# Patient Record
Sex: Male | Born: 1975
Health system: Southern US, Community
[De-identification: ages and names within clinical notes are randomized; demographics above are authoritative.]

## PROBLEM LIST (undated history)

## (undated) DIAGNOSIS — D649 Anemia, unspecified: Secondary | ICD-10-CM

## (undated) DIAGNOSIS — J45909 Unspecified asthma, uncomplicated: Secondary | ICD-10-CM

## (undated) DIAGNOSIS — I2699 Other pulmonary embolism without acute cor pulmonale: Secondary | ICD-10-CM

## (undated) DIAGNOSIS — I82409 Acute embolism and thrombosis of unspecified deep veins of unspecified lower extremity: Secondary | ICD-10-CM

## (undated) DIAGNOSIS — I8290 Acute embolism and thrombosis of unspecified vein: Secondary | ICD-10-CM

## (undated) HISTORY — DX: Unspecified asthma, uncomplicated: J45.909

---

## 2008-06-27 ENCOUNTER — Emergency Department (HOSPITAL_COMMUNITY): Admission: EM | Admit: 2008-06-27 | Discharge: 2008-06-27 | Payer: Self-pay | Admitting: Emergency Medicine

## 2008-07-16 ENCOUNTER — Inpatient Hospital Stay (HOSPITAL_COMMUNITY): Admission: EM | Admit: 2008-07-16 | Discharge: 2008-07-19 | Payer: Self-pay | Admitting: Family Medicine

## 2008-08-16 ENCOUNTER — Inpatient Hospital Stay (HOSPITAL_COMMUNITY): Admission: EM | Admit: 2008-08-16 | Discharge: 2008-08-19 | Payer: Self-pay | Admitting: Emergency Medicine

## 2010-01-10 IMAGING — CT CT ABDOMEN W/ CM
2 of 4 series · 16 of 46 positions shown, 18 images · IV contrast (APPLIED)
Comparison: CT abdomen pelvis 06/27/2008

CT ABDOMEN

CLINICAL DATA: Right lower quadrant abdominal pain and fever.
Recent history of urinary tract infection/pyelonephritis.

CT ABDOMEN AND PELVIS WITH CONTRAST
TECHNIQUE: Multidetector CT imaging of the abdomen and pelvis was
performed using the standard protocol following bolus
administration of intravenous contrast.
Contrast: 80 ml Tmnipaque-TYY

[Series 2: abd/pelv with 5.0 b31f st · axial · 0.65mm/px · z∈[-606,-201]mm · 13 of 89 slices shown, 15 images]
[im 4/89  soft-tissue]
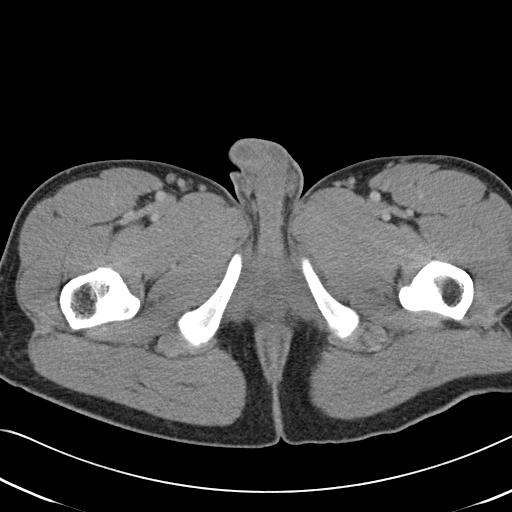
[im 4/89  bone]
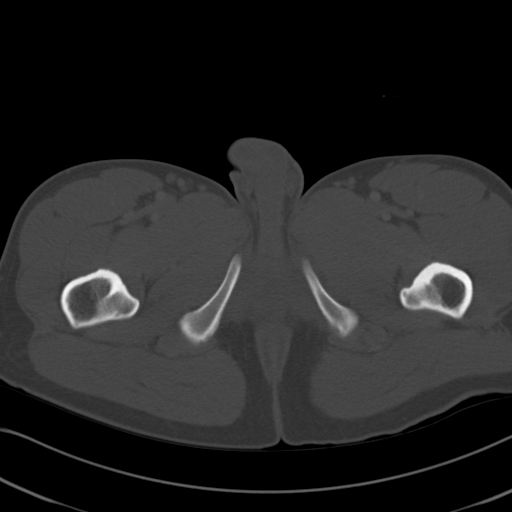
[im 12/89  soft-tissue]
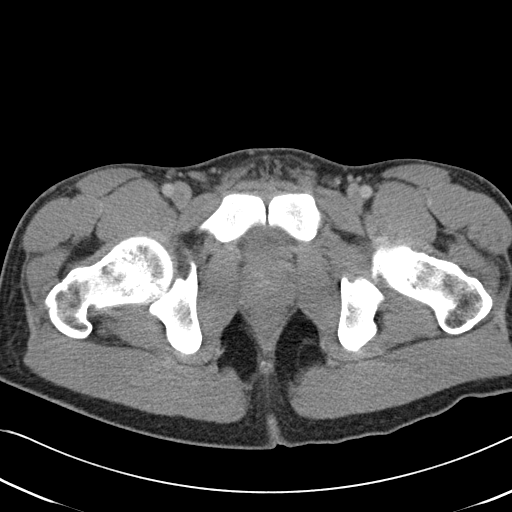
[im 20/89  soft-tissue]
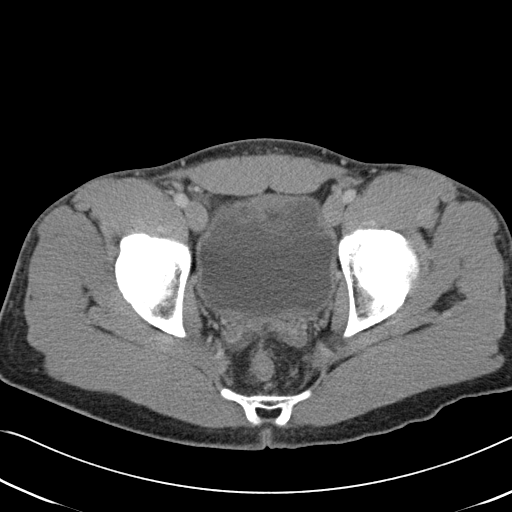
[im 23/89  soft-tissue]
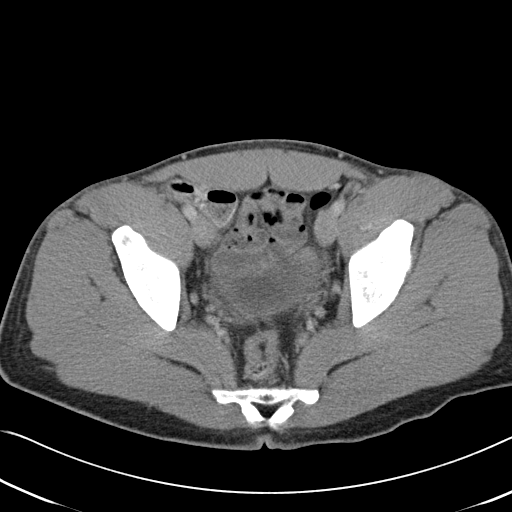
[im 31/89  soft-tissue]
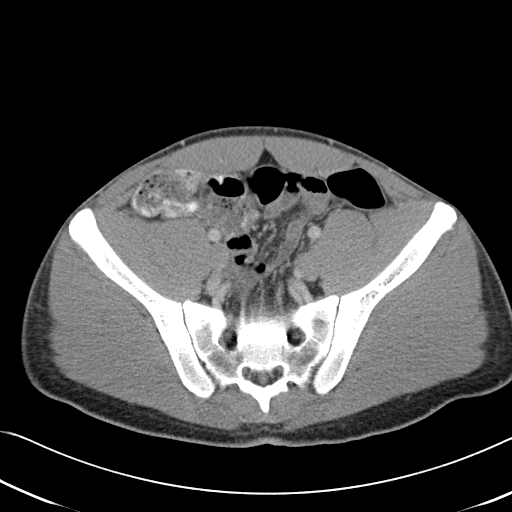
[im 39/89  soft-tissue]
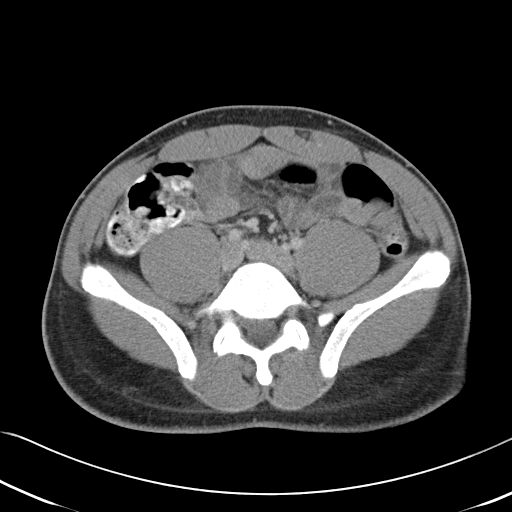
[im 46/89  soft-tissue]
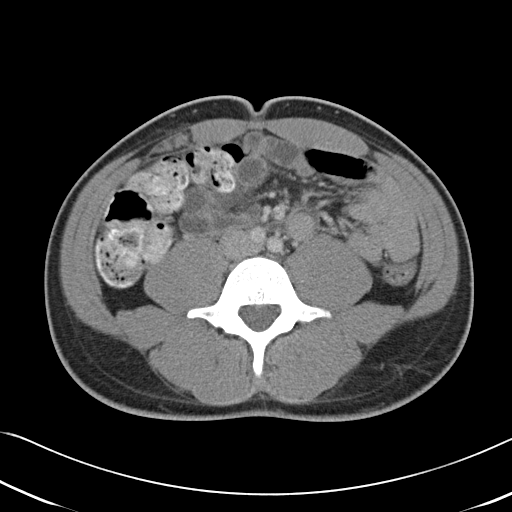
[im 50/89  soft-tissue]
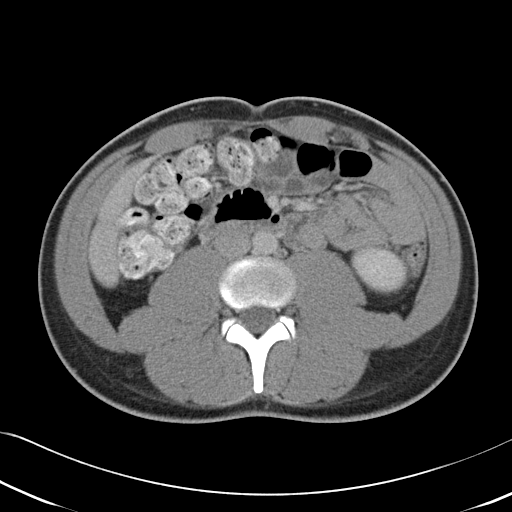
[im 58/89  soft-tissue]
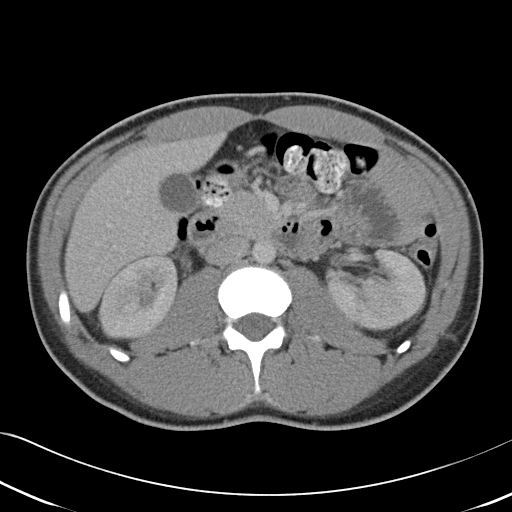
[im 58/89  bone]
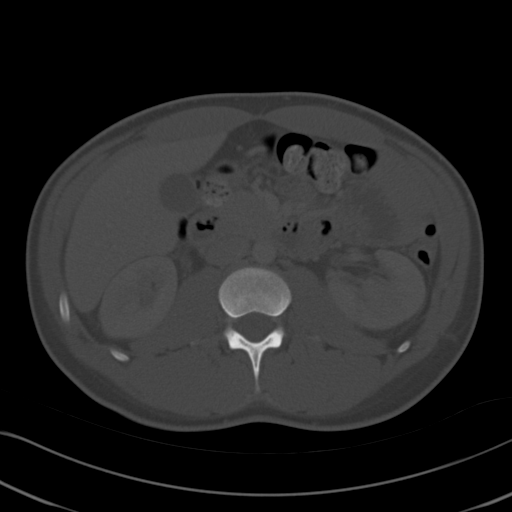
[im 66/89  soft-tissue]
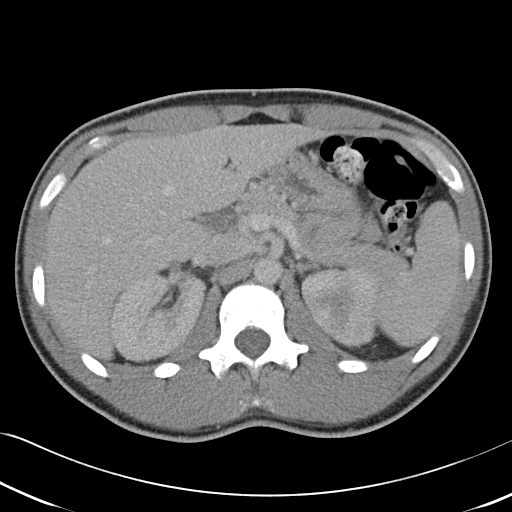
[im 69/89  soft-tissue]
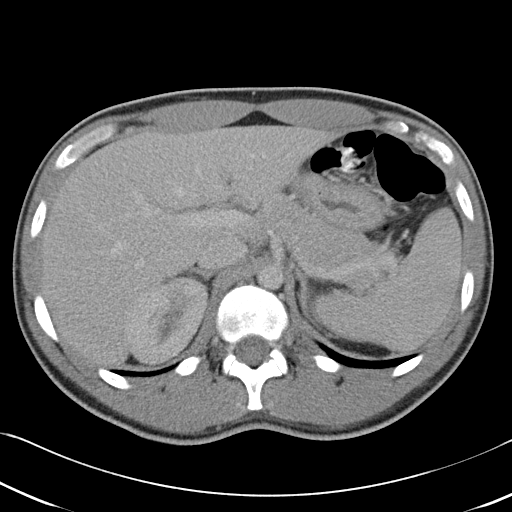
[im 77/89  soft-tissue]
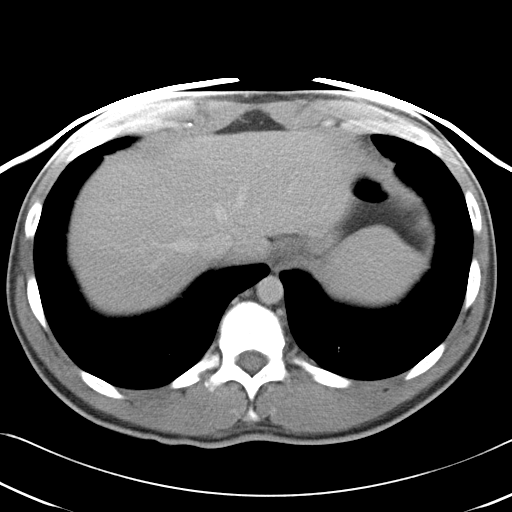
[im 85/89  soft-tissue]
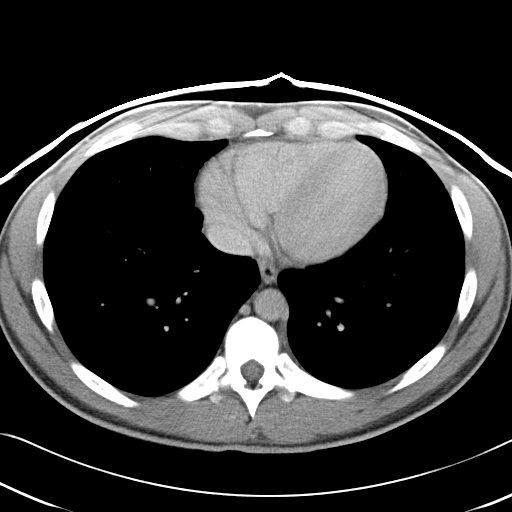

[Series 5: abd/pelv with 2.0 spo st · coronal · 0.84mm/px · 3 of 111 slices shown]
[im 37/111  soft-tissue]
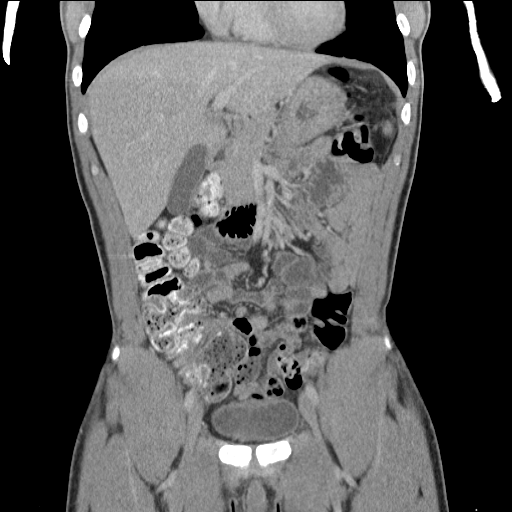
[im 49/111  soft-tissue]
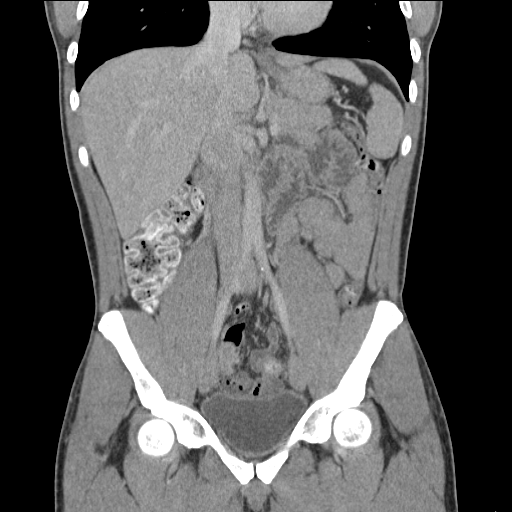
[im 62/111  soft-tissue]
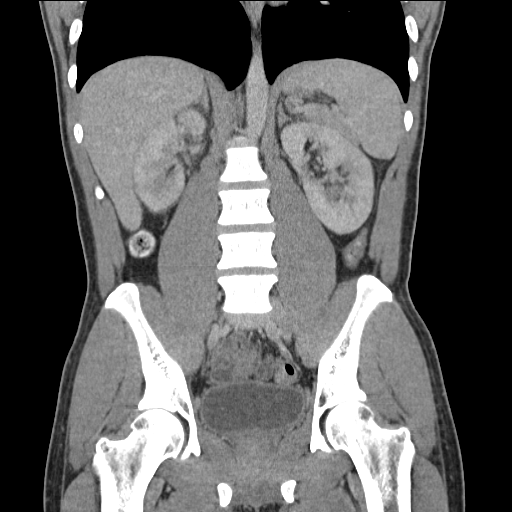

[16 of 46 positions shown; findings below may reference images not displayed]

FINDINGS: Visualized lung bases are clear.  Negative for pleural effusion.

Kidneys are normal in size measuring approximately 10 cm in length
bilaterally.  There is no hydronephrosis.  A focal area of patchy
decreased enhancement of the cortex of the posterior left kidney in
the mid to lower pole likely reflects pyelonephritis.  There is
persistent circumferential enhancement of the urothelial lining of
the proximal to mid left ureter, which appears similar compared to
the 06/27/2008.  Urothelial enhancement is also noted in the
infundibulum and calyces of the upper pole and of the infundibulum
to the lower pole (best seen on image 58 of series 5, coronal).
There is mild left perinephric stranding.  No renal abscess is
identified. No renal scarring identified.

The liver, gallbladder, adrenal glands, spleen, pancreas,
gallbladder, biliary tree are  within normal limits.

Bowel loops are normal in caliber.

Negative for lymphadenopathy.

The visualized vertebral bodies normal in height and alignment.
IMPRESSION: 1.  Abnormal urothelial enhancement of the collecting system of the
left kidney and proximal to mid left ureter consistent with urinary
tract infection.  Focal patchy area of decreased enhancement in the
posterior mid to lower pole of the left kidney suggests focal
pyelonephritis.
2.  No renal abscess is identified.

CT PELVIS
FINDINGS: The urinary bladder is moderately distended with urine.
The urinary bladder wall is within normal limits for thickness.
The distal ureters are unremarkable.  Seminal vesicles and prostate
gland are within normal limits.  Pelvic bowel loops are not
distended.  No free fluid or lymphadenopathy identified.  Bony
pelvis unremarkable.
IMPRESSION: No acute worrisome findings in the pelvis.

## 2010-06-27 LAB — CBC
HCT: 36.8 % — ABNORMAL LOW (ref 39.0–52.0)
Hemoglobin: 12.5 g/dL — ABNORMAL LOW (ref 13.0–17.0)
MCV: 89.2 fL (ref 78.0–100.0)
Platelets: 238 10*3/uL (ref 150–400)
WBC: 6.7 10*3/uL (ref 4.0–10.5)

## 2010-06-27 LAB — BASIC METABOLIC PANEL
BUN: 9 mg/dL (ref 6–23)
Chloride: 108 mEq/L (ref 96–112)
Potassium: 4.4 mEq/L (ref 3.5–5.1)
Sodium: 141 mEq/L (ref 135–145)

## 2010-06-28 LAB — URINE CULTURE

## 2010-06-28 LAB — CULTURE, BLOOD (ROUTINE X 2)
Culture: NO GROWTH
Culture: NO GROWTH

## 2010-06-28 LAB — DIFFERENTIAL
Basophils Absolute: 0.1 10*3/uL (ref 0.0–0.1)
Basophils Relative: 0 % (ref 0–1)
Basophils Relative: 1 % (ref 0–1)
Eosinophils Absolute: 0.1 10*3/uL (ref 0.0–0.7)
Eosinophils Absolute: 0.2 10*3/uL (ref 0.0–0.7)
Lymphocytes Relative: 18 % (ref 12–46)
Lymphs Abs: 1.3 10*3/uL (ref 0.7–4.0)
Monocytes Absolute: 1.3 10*3/uL — ABNORMAL HIGH (ref 0.1–1.0)
Monocytes Relative: 11 % (ref 3–12)
Monocytes Relative: 9 % (ref 3–12)
Neutro Abs: 4.8 10*3/uL (ref 1.7–7.7)
Neutro Abs: 9.1 10*3/uL — ABNORMAL HIGH (ref 1.7–7.7)
Neutrophils Relative %: 76 % (ref 43–77)

## 2010-06-28 LAB — POCT I-STAT, CHEM 8
BUN: 11 mg/dL (ref 6–23)
Calcium, Ion: 1.16 mmol/L (ref 1.12–1.32)
Chloride: 103 mEq/L (ref 96–112)
Creatinine, Ser: 1.7 mg/dL — ABNORMAL HIGH (ref 0.4–1.5)
Glucose, Bld: 104 mg/dL — ABNORMAL HIGH (ref 70–99)
Potassium: 3.6 mEq/L (ref 3.5–5.1)

## 2010-06-28 LAB — HEPATIC FUNCTION PANEL
ALT: 11 U/L (ref 0–53)
AST: 30 U/L (ref 0–37)
Alkaline Phosphatase: 63 U/L (ref 39–117)
Bilirubin, Direct: 0.4 mg/dL — ABNORMAL HIGH (ref 0.0–0.3)
Indirect Bilirubin: 1.6 mg/dL — ABNORMAL HIGH (ref 0.3–0.9)

## 2010-06-28 LAB — CBC
HCT: 32.5 % — ABNORMAL LOW (ref 39.0–52.0)
Hemoglobin: 11.1 g/dL — ABNORMAL LOW (ref 13.0–17.0)
MCHC: 34.4 g/dL (ref 30.0–36.0)
MCV: 90 fL (ref 78.0–100.0)
Platelets: 243 10*3/uL (ref 150–400)
RBC: 3.61 MIL/uL — ABNORMAL LOW (ref 4.22–5.81)
RBC: 3.86 MIL/uL — ABNORMAL LOW (ref 4.22–5.81)
RDW: 14.9 % (ref 11.5–15.5)
WBC: 6.9 10*3/uL (ref 4.0–10.5)
WBC: 7.6 10*3/uL (ref 4.0–10.5)

## 2010-06-28 LAB — URINALYSIS, ROUTINE W REFLEX MICROSCOPIC
Bilirubin Urine: NEGATIVE
Glucose, UA: NEGATIVE mg/dL
Ketones, ur: NEGATIVE mg/dL
Nitrite: POSITIVE — AB
pH: 6 (ref 5.0–8.0)

## 2010-06-28 LAB — BASIC METABOLIC PANEL
Calcium: 8.5 mg/dL (ref 8.4–10.5)
GFR calc Af Amer: >60 mL/min (ref >60)
GFR calc non Af Amer: >60 mL/min (ref >60)
Potassium: 4.6 mEq/L (ref 3.5–5.1)
Sodium: 139 mEq/L (ref 135–145)

## 2010-06-28 LAB — URINE MICROSCOPIC-ADD ON

## 2010-06-28 LAB — MAGNESIUM: Magnesium: 2.1 mg/dL (ref 1.5–2.5)

## 2010-06-29 LAB — POCT I-STAT, CHEM 8
Calcium, Ion: 1.13 mmol/L (ref 1.12–1.32)
Creatinine, Ser: 1.7 mg/dL — ABNORMAL HIGH (ref 0.4–1.5)
Glucose, Bld: 107 mg/dL — ABNORMAL HIGH (ref 70–99)
HCT: 35 % — ABNORMAL LOW (ref 39.0–52.0)
Hemoglobin: 11.9 g/dL — ABNORMAL LOW (ref 13.0–17.0)
Potassium: 4.1 mEq/L (ref 3.5–5.1)
TCO2: 26 mmol/L (ref 0–100)

## 2010-06-29 LAB — CBC
Hemoglobin: 10.7 g/dL — ABNORMAL LOW (ref 13.0–17.0)
Hemoglobin: 12.1 g/dL — ABNORMAL LOW (ref 13.0–17.0)
MCHC: 35.2 g/dL (ref 30.0–36.0)
MCHC: 35.2 g/dL (ref 30.0–36.0)
MCV: 87.9 fL (ref 78.0–100.0)
MCV: 90.1 fL (ref 78.0–100.0)
Platelets: 230 10*3/uL (ref 150–400)
Platelets: 179 10*3/uL (ref 150–400)
RBC: 3.46 MIL/uL — ABNORMAL LOW (ref 4.22–5.81)
RBC: 3.5 MIL/uL — ABNORMAL LOW (ref 4.22–5.81)
RBC: 3.95 MIL/uL — ABNORMAL LOW (ref 4.22–5.81)
WBC: 10.8 10*3/uL — ABNORMAL HIGH (ref 4.0–10.5)
WBC: 15.3 10*3/uL — ABNORMAL HIGH (ref 4.0–10.5)
WBC: 17.2 10*3/uL — ABNORMAL HIGH (ref 4.0–10.5)
WBC: 16.3 10*3/uL — ABNORMAL HIGH (ref 4.0–10.5)

## 2010-06-29 LAB — CULTURE, BLOOD (ROUTINE X 2)

## 2010-06-29 LAB — DIFFERENTIAL
Basophils Relative: 0 % (ref 0–1)
Basophils Relative: 0 % (ref 0–1)
Eosinophils Absolute: 0 10*3/uL (ref 0.0–0.7)
Lymphs Abs: 1.3 10*3/uL (ref 0.7–4.0)
Lymphs Abs: 0.6 10*3/uL — ABNORMAL LOW (ref 0.7–4.0)
Monocytes Absolute: 1.4 10*3/uL — ABNORMAL HIGH (ref 0.1–1.0)
Monocytes Relative: 8 % (ref 3–12)
Neutro Abs: 14.5 10*3/uL — ABNORMAL HIGH (ref 1.7–7.7)
Neutro Abs: 14.2 10*3/uL — ABNORMAL HIGH (ref 1.7–7.7)
Neutrophils Relative %: 87 % — ABNORMAL HIGH (ref 43–77)

## 2010-06-29 LAB — BASIC METABOLIC PANEL
BUN: 10 mg/dL (ref 6–23)
BUN: 12 mg/dL (ref 6–23)
CO2: 26 mEq/L (ref 19–32)
Calcium: 8.1 mg/dL — ABNORMAL LOW (ref 8.4–10.5)
Calcium: 8.4 mg/dL (ref 8.4–10.5)
Calcium: 8.8 mg/dL (ref 8.4–10.5)
Chloride: 104 mEq/L (ref 96–112)
Chloride: 99 mEq/L (ref 96–112)
Creatinine, Ser: 1.29 mg/dL (ref 0.4–1.5)
Creatinine, Ser: 1.38 mg/dL (ref 0.4–1.5)
Creatinine, Ser: 1.56 mg/dL — ABNORMAL HIGH (ref 0.4–1.5)
GFR calc Af Amer: >60 mL/min (ref >60)
GFR calc Af Amer: >60 mL/min (ref >60)
GFR calc non Af Amer: >60 mL/min (ref >60)
Sodium: 139 mEq/L (ref 135–145)

## 2010-06-29 LAB — URINE MICROSCOPIC-ADD ON

## 2010-06-29 LAB — URINE CULTURE

## 2010-06-29 LAB — POCT URINALYSIS DIP (DEVICE)
Bilirubin Urine: NEGATIVE
Ketones, ur: NEGATIVE mg/dL
Protein, ur: 30 mg/dL — AB
Specific Gravity, Urine: 1.01 (ref 1.005–1.030)
pH: 6 (ref 5.0–8.0)

## 2010-06-29 LAB — URINALYSIS, ROUTINE W REFLEX MICROSCOPIC
Bilirubin Urine: NEGATIVE
Glucose, UA: NEGATIVE mg/dL
Ketones, ur: NEGATIVE mg/dL
Nitrite: POSITIVE — AB
Protein, ur: 30 mg/dL — AB
Protein, ur: 100 mg/dL — AB
Specific Gravity, Urine: 1.017 (ref 1.005–1.030)
Urobilinogen, UA: 1 mg/dL (ref 0.0–1.0)
pH: 5.5 (ref 5.0–8.0)

## 2010-06-29 LAB — LACTIC ACID, PLASMA: Lactic Acid, Venous: 1 mmol/L (ref 0.5–2.2)

## 2010-06-29 LAB — HEPATIC FUNCTION PANEL
ALT: 16 U/L (ref 0–53)
AST: 19 U/L (ref 0–37)
Albumin: 2.8 g/dL — ABNORMAL LOW (ref 3.5–5.2)
Alkaline Phosphatase: 91 U/L (ref 39–117)
Bilirubin, Direct: 0.2 mg/dL (ref 0.0–0.3)
Total Bilirubin: 1.7 mg/dL — ABNORMAL HIGH (ref 0.3–1.2)

## 2010-06-29 LAB — GC/CHLAMYDIA PROBE AMP, GENITAL: GC Probe Amp, Genital: NEGATIVE

## 2010-06-29 LAB — HIV ANTIBODY (ROUTINE TESTING W REFLEX): HIV: NONREACTIVE

## 2010-08-02 NOTE — Discharge Summary (Signed)
Jeremiah [name], Jeremiah [name]                 ACCOUNT NO.:  [id number]   MEDICAL RECORD NO.:  [id number]          PATIENT TYPE:  INP   LOCATION:  5122                         FACILITY:  [facility]   PHYSICIAN:  Hind I Elsaid, MD      DATE OF BIRTH:  [birth year]   DATE OF ADMISSION:  07/15/2008  DATE OF DISCHARGE:  07/19/2008                               DISCHARGE SUMMARY   DISCHARGE DIAGNOSES:  1. Extended-spectrum beta-lactamase producing Escherichia coli      bacteremia.  2. Extended-spectrum beta-lactamase pyelonephritis.  3. History of sexually transmitted diseases in the past.   CONSULTATION:  None.   MEDICATIONS:  1. Bactrim 1 tab p.o. b.i.d. for 2 weeks.  2. Percocet 1 tab p.o. b.i.d. p.r.n. for pain.   HISTORY OF PRESENT ILLNESS:  This is a 35 year old male with a past  medical history of significant for STD, presented with fever and right  lower quadrant pain.   PROCEDURE:  CT of the abdomen and pelvis which did show abnormal  urethral enhancement of the collecting system of the left kidney and  proximal to the mid left ureter consistent with urinary tract infection.  Focal patchy area of decreased enhancement in the posterior mid to lower  pole of the left kidney suggest focal pyelonephritis.  No renal abscess  is identified and no acute worrisome finding in the pelvis.  Ultrasound  of the abdomen:  Very small focus of shadowing in right lobe of the  liver, uncertain significance; mild thickening and slightly echogenic  renal cortex raising medical renal problem, exam unremarkable.   ASSESSMENT AND PLAN:  The patient admitted for diagnosis of acute  pyelonephritis.  The patient started on broad-spectrum antibiotic mainly  Primaxin, has human immunodeficiency virus test was done which was  negative, and urine chlamydia and gonococcal was negative.  Urine  cultures did grow Escherichia coli extended-spectrum beta-lactamase  producing which is sensitive to Primaxin and Bactrim.  Also,  the patient  noted to have blood culture, one bottle, positive for extended-spectrum  beta-lactamase producing Escherichia coli.  The patient continued on  Primaxin.  The patient today has no fever.  No white blood cells and  symptoms completely resolved.  The patient will be switched to Bactrim  DS 1 tab p.o. b.i.d. for 2 weeks.  The patient also advised to follow up  with primary care physician.  The patient may need to follow up with  Urology for evaluation of any other abnormality of the kidney and  bladder, possibility of congenital abnormality of the urethra, or  stricture, may need to have IVP or urodynamics studies.  At this time,  the patient was medically stable to be discharged home.      Hind Bosie Helper, MD  Electronically Signed    HIE/MEDQ  D:  07/19/2008  T:  07/20/2008  Job:  [id number]

## 2010-08-02 NOTE — Discharge Summary (Signed)
NAMECARLYLE, [name]                 ACCOUNT NO.:  [id number]   MEDICAL RECORD NO.:  [id number]          PATIENT TYPE:  INP   LOCATION:  5156                         FACILITY:  [facility]   PHYSICIAN:  Ruthy Dick, MD    DATE OF BIRTH:  04/10/1975   DATE OF ADMISSION:  08/16/2008  DATE OF DISCHARGE:  08/19/2008                               DISCHARGE SUMMARY   REASON FOR ADMISSION:  Back pain and cramping with micturition.   FINAL DISCHARGE DIAGNOSES:  1. Pyelonephritis with extended beta-lactamase Escherichia coli.  2. Tobacco abuse.  3. Hemorrhoids.   CONSULTS DURING THIS ADMISSION:  Urology consult was anticipated and Dr.  Patsi Sears is aware of this patient.  Plan was to have this patient see  him in the outpatient setting.  This appointment has been made.   BRIEF HISTORY OF PRESENT ILLNESS AND HOSPITAL COURSE:  This is a 35-year-  old African American male who presents for the third time within the  past 2 months with urinary tract issues.  He came in with back pain and  cramping at micturition.  Urinalysis revealed urinary tract infection  and a urine culture and sensitivity showed E. coli which turned out to  be extended spectrum beta-lactamase producer.  It turned out to be  sensitive to Invanz and gentamicin.  The patient has been on 3-4 days of  Invanz and will continue another 6 days of Invanz.  He has been afebrile  in the last 24 hours.  He says he has no severe symptoms at this time,  but he continues to request some pain medications.  No chest pain, no  nausea, no vomiting, no diarrhea, and no constipation.  He admits to  having hemorrhoids for which he is taking Anusol with improvement in his  symptoms.   PHYSICAL EXAMINATION:  VITAL SIGNS:  His vitals today are temperature  97.7, pulse 70, respirations 18, blood pressure 120/78, and saturating  98% on room air.  CHEST:  Clear to auscultation bilaterally.  ABDOMEN:  Soft and nontender.  EXTREMITIES:  No clubbing,  no cyanosis, no edema.  CARDIOVASCULAR:  First and second heart sounds only.  CENTRAL NERVOUS SYSTEM:  Nonfocal.   The patient is to follow with Dr. Della Goo on August 25, 2008 and  an appointment has also been made with [facility] for the patient  to see either the nurse practitioner or Dr. Patsi Sears.  This will be on  August 26, 2008, at 12:45 p.m.  This has been discussed in detail with the  patient and the patient plans to follow with the plan of care.  He is to  go home on Invanz 1 g IV q.24 h for 6 more days, making to a total of 10  days, Anusol Suppository 1 suppository twice a day per rectum for 6 more  days, and Lortab 5/500 p.o. q.6 h p.r.n. 20 tablets.   Time used for discharge planning greater than 30 minutes.  Home health  will be ordered for IV antibiotics.  Discontinue IV line after IV  antibiotics.  Thank you so much  for this dictation.      Ruthy Dick, MD  Electronically Signed     GU/MEDQ  D:  08/19/2008  T:  08/20/2008  Job:  [id number]   cc:   Lynelle Smoke I. Patsi Sears, M.D.  Della Goo, M.D.

## 2010-08-02 NOTE — H&P (Signed)
NAMEQUINCE, Jeremiah                 ACCOUNT NO.:  [id number]   MEDICAL RECORD NO.:  [id number]          PATIENT TYPE:  INP   LOCATION:  5125                         FACILITY:  [facility]   PHYSICIAN:  Darryl D. Prime, MD    DATE OF BIRTH:  [birth year]   DATE OF ADMISSION:  07/15/2008  DATE OF DISCHARGE:                              HISTORY & PHYSICAL   CODE STATUS:  The patient is a Full Code.   CHIEF COMPLAINT:  Fever and pain.   HISTORY OF THE PRESENT ILLNESS:  The patient is a 35 year old male with  no significant past medical history, except for STD in the past who  notes that about a month ago he had discharge after having intercourse  with his fiancee.  The patient subsequently about a week later developed  right groin pain.  He was seen in the emergency room on June 27, 2008.  He was given IV Cipro and was sent home on 2 weeks of ciprofloxacin with  resolution of the extreme pain in that area.  The patient notes pain  again felt over the last few days and right lower quadrant pain.  The  pain has been progressive, which prompted him to come into the emergency  room tonight.  He notes associated sweats.  He notes fever.  The pain is  in the right groin and right flank.  CT of the abdomen and pelvis shows  a  redemonstration of the left kidney proximal and mid pole enhancement  consistent with possible infection; and, also had decreased enhancement  in the left lower pole of the kidney consistent with pyelonephritis.  His prostate was normal.  The patient in the emergency room was placed  on vancomycin.   PAST MEDICAL AND SURGICAL HISTORY:  The patient has no significant past  medical history, except for STDs in the past.   ALLERGIES:  THE PATIENT IS ALLERGIC TO PENICILLIN; HE IS NOT SURE WHAT  WAS THE NATURE OF THE ALLERGY.   MEDICATIONS:  Tylenol as needed.   SOCIAL HISTORY:  The patient discontinued tobacco and alcohol a few  years ago.   FAMILY HISTORY:  The patient's  mother passed away with cancer of the  stomach in 2006.  Father recently passed away.  He had multiple medical  problems, but did not want to a doctor.   REVIEW OF SYSTEMS:  A 14-point review of systems is negative unless as  stated above.   PHYSICAL EXAMINATION:  VITAL SIGNS:  Temperature is 103.5 with a blood  pressure of 131/71, pulse of 98, respiratory rate of 18, and sat of 97%  on room air.  GENERAL APPEARANCE:  In general the patient is a male who looks his  stated age, is sitting upright in bed and is in no acute distress.  HEENT:  Normocephalic and atraumatic head.  Pupils are equal, round and  react to light with extraocular movements being intact  The oropharynx  is very dry.  NECK:  The neck is supple with no lymphadenopathy or thyromegaly.  No  carotid bruits.  HEART:  The cardiovascular exam is regular rhythm and rate.  LUNGS:  The lungs clear to auscultation bilaterally.  ABDOMEN:  The abdomen is soft, nontender and nondistended with no  hepatosplenomegaly.  He has right-sided CVA tenderness  despite the CT  findings and apparently right-sided flank pain.  EXTREMITIES:  The patient's extremities show no clubbing, cyanosis or  edema.  NEUROLOGIC:  On neurological exam he is alert and oriented x4.  Cranial  nerves II-XII are grossly intact.  Strength and sensation are grossly  intact.  RECTAL:  The prostate exam discloses a prostate that is nontender with  no signs of bogginess.   LABORATORY DATA:  The patient has a white count of 17.2 with a  hemoglobin of 12.1, hematocrit of 34.6 and platelets of 282,000 with  segs of 84%.  Sodium is 136 with a potassium of 4.1, chloride 103,  bicarb 26, BUN 14, creatinine 1.7, and glucose 107.  Urinalysis showed  WBCs 15-21 with RBCs 0-2, bacteria many, nitrite positive, leukocyte  esterase large, and blood large.  Total bilirubin 1.7, indirect 1.5, and  albumin 2.8.  CT of the abdomen and pelvis on July 15, 2008, the day of   admission, showed abnormal urethral enhancement of the collecting system  of the left kidney and proximal-to-mid left ureter consistent with  urinary tract infection.  There was a focal patchy area of decreased  enhancement in the posterior, mid and lower pole of the left kidney  suggesting focal pyelonephritis.  He had a CT of the abdomen on the  June 27, 2008, when he came in initially and when he received  ciprofloxacin, that showed similar findings.  The patient's GC and  Chlamydia are pending.   ASSESSMENT:  This is a patient with a history of no significant medical  problems, except for sexually transmitted diseases who has had a recent  penile discharge and now has pyelonephritis.  I am unsure if this is  related to a sexually transmitted disease or if this is a pyelonephritis  from enteric bacteria.  It is very uncommon to have a pyelonephritis in  a male of this age.   PLAN:  1. We will check a human immunodeficiency virus.  2. We will check blood cultures and urine cultures.  3. We will give him intravenous fluids and Primaxin.  4. Deep venous thrombosis and gastrointestinal prophylaxes will be      ordered.  5. We will follow up on the gonorrhea and Chlamydia.      Darryl D. Prime, MD  Electronically Signed     DDP/MEDQ  D:  07/16/2008  T:  07/16/2008  Job:  [id number]

## 2010-08-02 NOTE — H&P (Signed)
[name], Jeremiah [name]                 ACCOUNT NO.:  [id number]   MEDICAL RECORD NO.:  [id number]          PATIENT TYPE:  INP   LOCATION:  5156                         FACILITY:  [facility]   PHYSICIAN:  Vania Rea, M.D. DATE OF BIRTH:  [birth year]   DATE OF ADMISSION:  08/16/2008  DATE OF DISCHARGE:                              HISTORY & PHYSICAL   PRIMARY CARE PHYSICIAN:  Unassigned.   CHIEF COMPLAINT:  Back pain and cramping with micturition.   HISTORY OF PRESENT ILLNESS:  This is a 35 year old African-American  gentleman who is presenting for the third time within the past 2 months  with urinary tract problems.  The patient initially presented to the  emergency room in early April complaining of urethral discharge.  He  progressed to colicky flank pain, was evaluated, and felt to be having  pyelonephritis.  He was started on treatment in the emergency room and  then discharged home with a course of antibiotics. The patient states  that he took antibiotics for approximately 10 days, but at the end of  the course within a few days his symptoms recurred. He came back to the  emergency room. He was diagnosed again with pyelonephritis.  He was  admitted on the hospitalist service for about 5 days.  He was evaluated.  Demonstrated on imaging urethritis and pyelonephritis.  Cultures proved  Beta-lactamase-resistant E. coli, and he was discharged home on a 2-week  course of Bactrim. The patient again states his problems resolved until  now for the past few days he has been having initially left flank pain  now also right flank pain radiating  down to his lower abdomen, cramping  with micturition. He states that he had  1 episode of urethral discharge  3 days ago but that discharge has not recurred.  He also has been having  some epigastric pain.  The patient has not taken his temperature, but  his girlfriend reports that he has been feeling very hot for the past  few days, and in the  emergency room temperature of 100.8 has been  recorded.  He has been having no headaches.  No nausea or vomiting.  He  does feel he has been losing weight. He has been constipated and passed  hard stools for the first time yesterday after 1-1/2 weeks, and he feels  he has hemorrhoids.   PAST MEDICAL HISTORY:  Recurrent pyelonephritis.   MEDICATIONS:  None.   ALLERGIES:  PENICILLIN.  He thinks this causes anaphylaxis.   SOCIAL HISTORY:  He smokes 5-6 cigarettes per day.  He denies alcohol or  illicit drug use.   FAMILY HISTORY:  Significant for mother who had breast cancer and lately  some type of abdominal cancer and passed away. Father died of  complications from diabetes and stroke.   REVIEW OF SYSTEMS:  A 10-point review of systems other than noted above  was unremarkable.   PHYSICAL EXAMINATION:  GENERAL: Well-built, muscular, young African-  American gentleman, lying in the stretcher, appears distressed by pain.  VITAL SIGNS:  Temperature 100.8, pulse 64, respirations 18, blood  pressure 120/68.  He is saturating 100% on room air.  HEENT:  His pupils are round and equal.  Mucous membranes are pink and  anicteric. Mild dehydration.  NECK:  No cervical lymphadenopathy or thyromegaly or jugular venous  distention.  CHEST:  Clear to auscultation bilaterally.  CARDIOVASCULAR: Regular rhythm without murmur.  ABDOMEN:  Scaphoid. He does have epigastric and suprapubic tenderness as  well as bilateral flank tenderness.  EXTREMITIES:  Without edema. He has 2+ dorsalis pedis pulses  bilaterally.  RECTAL: He has external bile at the 7 o'clock area of his anus.  CENTRAL NERVOUS SYSTEM:  Cranial nerves II through XII are grossly  intact.  He has no focal neurological deficits.   LABS:  White count was elevated at 11.9, hemoglobin 12, hematocrit 34.8,  platelets 198. His absolute granulocyte count is elevated at 9.1.  Monocytes 1.3.  His liver functions are significant for total  protein of  5.9 and albumin 3.3. His total bilirubin is 2, but his indirect 1.6, and  his direct is normal at 0.4. His sodium was 138, potassium 3.6, chloride  103, BUN 11, creatinine 1.7, glucose 104.  Urinalysis was significant  for nitrite positivity and leukocyte esterase large, microscopy of his  urine showed 21-50 white cells, many bacteria, and WBC clumps.   ASSESSMENT:  1. Urinary tract infection, likely pyelonephritis.  2. Constipation with hemorrhoids.  3. Renal insufficiency.   PLAN:  1. Will admit this gentleman for further investigation and treatment.      Will start him on ertapenem since previous culture suggested      resistant Escherichia coli.  But will have blood and urine cultures      done prior to starting antibiotics. Will avoid Bactrim for the time      being because of his renal insufficiency.  2. Will hydrate him vigorously and will give MiraLax as well as      Senokot for his constipation.  3. Will give Anusol suppositories and pain medication for his      hemorrhoids.  Will give nicotine patch for his tobacco addiction.  4. Because this patient has recurrent urinary tract problems, he may      benefit from an evaluation with urologist. Other plans as per      orders.      Vania Rea, M.D.  Electronically Signed     LC/MEDQ  D:  08/16/2008  T:  08/16/2008  Job:  [id number]   cc:   Hassan Buckler. Weldon Inches, MD

## 2015-01-02 ENCOUNTER — Emergency Department (HOSPITAL_COMMUNITY)
Admission: EM | Admit: 2015-01-02 | Discharge: 2015-01-02 | Disposition: A | Discharge status: Home / Self Care | Payer: Self-pay | Attending: Emergency Medicine | Admitting: Emergency Medicine

## 2015-01-02 ENCOUNTER — Encounter (HOSPITAL_COMMUNITY): Payer: Self-pay | Admitting: Emergency Medicine

## 2015-01-02 DIAGNOSIS — R1032 Left lower quadrant pain: Secondary | ICD-10-CM | POA: Insufficient documentation

## 2015-01-02 DIAGNOSIS — Z720 Tobacco use: Secondary | ICD-10-CM | POA: Insufficient documentation

## 2015-01-02 DIAGNOSIS — Z880 Allergy status to penicillin: Secondary | ICD-10-CM | POA: Insufficient documentation

## 2015-01-02 DIAGNOSIS — Z862 Personal history of diseases of the blood and blood-forming organs and certain disorders involving the immune mechanism: Secondary | ICD-10-CM | POA: Insufficient documentation

## 2015-01-02 DIAGNOSIS — N4889 Other specified disorders of penis: Secondary | ICD-10-CM | POA: Insufficient documentation

## 2015-01-02 HISTORY — DX: Anemia, unspecified: D64.9

## 2015-01-02 LAB — URINALYSIS, ROUTINE W REFLEX MICROSCOPIC
Bilirubin Urine: NEGATIVE
Glucose, UA: NEGATIVE mg/dL
Hgb urine dipstick: NEGATIVE
Ketones, ur: NEGATIVE mg/dL
Leukocytes, UA: NEGATIVE
Nitrite: NEGATIVE
Protein, ur: NEGATIVE mg/dL
Specific Gravity, Urine: 1.014 (ref 1.005–1.030)
Urobilinogen, UA: 1 mg/dL (ref 0.0–1.0)
pH: 5.5 (ref 5.0–8.0)

## 2015-01-02 MED ORDER — DIPHENHYDRAMINE HCL 25 MG PO CAPS
25.0000 mg | ORAL_CAPSULE | Freq: Once | ORAL | Status: AC
  Administered 2015-01-02: 25 mg via ORAL
  Filled 2015-01-02: qty 1

## 2015-01-02 NOTE — ED Notes (Signed)
Reviewed discharge instructions with patient, patient verbalized understanding and states he will get established with a PCP.

## 2015-01-02 NOTE — Discharge Instructions (Signed)
Please use benadryl as needed for itch. Please contact your primary care provider and schedule immediate follow-up evaluation. If worsening symptoms present please return to the emergency for further evaluation.

## 2015-01-02 NOTE — ED Provider Notes (Signed)
CSN: [id number]     Arrival date & time 01/02/15  0809 History   First MD Initiated Contact with Patient 01/02/15 [phone]     Chief Complaint  Patient presents with  . Groin Swelling   HPI   39 year old male presents with swelling of his penis. Patient reports that symptoms started approximately 2 days ago associated with minor itching" sensitivity". He reports most of the edema is on the bottom shaft. Patient denies any significant trauma to the penis, discharge, rash, or any other lesions. Patient reports that remaining of the shaft and glans are normal, testicles normal size and nontender. Patient also reports pain to the left groin. Patient has fever, chills, nausea, vomiting, abdominal pain, changes in soaps, lotions, or any other skin care products. Remote history of STDs.   Past Medical History  Diagnosis Date  . Anemia    History reviewed. No pertinent past surgical history. No family history on file. Social History  Substance Use Topics  . Smoking status: Current Every Day Smoker -- 0.50 packs/day    Types: Cigarettes  . Smokeless tobacco: Never Used  . Alcohol Use: 9.0 oz/week    15 Cans of beer per week    Review of Systems  All other systems reviewed and are negative.   Allergies  Amoxicillin and Penicillins  Home Medications   Prior to Admission medications   Not on File   BP 121/81 mmHg  Pulse 61  Temp(Src) 98 F (36.7 C) (Oral)  Resp 16  SpO2 99%   Physical Exam  Constitutional: He is oriented to person, place, and time. He appears well-developed and well-nourished.  HENT:  Head: Normocephalic and atraumatic.  Eyes: Conjunctivae are normal. Pupils are equal, round, and reactive to light. Right eye exhibits no discharge. Left eye exhibits no discharge. No scleral icterus.  Neck: Normal range of motion. No JVD present. No tracheal deviation present.  Cardiovascular: Normal rate.   Pulmonary/Chest: Effort normal. No stridor.  Abdominal: Soft. Bowel sounds  are normal. He exhibits no distension and no mass. There is no tenderness. There is no rebound and no guarding.  Genitourinary:  Edema to the posterior aspect of shaft not extending into the glans. This area is approximately 1 cm in width, no circumferential swelling, capillary refill intact, nontender to palpation. No warmth, no surrounding cellulitis. Testicles within normal limits, nontender to palpation, with no swelling of the scrotum. Mildly tender left inguinal lymph nodes  Neurological: He is alert and oriented to person, place, and time. Coordination normal.  Psychiatric: He has a normal mood and affect. His behavior is normal. Judgment and thought content normal.  Nursing note and vitals reviewed.     ED Course  Procedures (including critical care time) Labs Review Labs Reviewed  URINALYSIS, ROUTINE W REFLEX MICROSCOPIC (NOT AT [facility])  RPR  HIV ANTIBODY (ROUTINE TESTING)  GC/CHLAMYDIA PROBE AMP ([place]) NOT AT [facility]    Imaging Review No results found. I have personally reviewed and evaluated these images and lab results as part of my medical decision-making.   EKG Interpretation None      MDM   Final diagnoses:  Edema, penis    Labs: HIV, syphilis, gonorrhea, chlamydia, urinalysis- urinalysis shows no significant findings  Imaging:  Consults:  Therapeutics: Initial  Discharge Meds:   Assessment/Plan: Patient in no acute distress, afebrile, vital signs are reassuring presents with localized edema to the bottom of his penis. No signs of infection present including redness, warmth, tenderness to palpation. There  seems to be no vascular compromise, as this is not circumferential. Patient has no discharge from his penis that would indicate a STD causing edema. Uncertain etiology of the edema at the present moment, STD pending. Patient offered prophylactic antibiotics in the event this was an STD causing edema, he refused reporting he would take antibiotics only if  results turn positive. Patient does have swollen lymph nodes that would indicate some sort of reactive process. Patient denies worsening of symptoms, lengthy discussion of treatment options with patient and his wife. They agreed that following up with primary care in 1-2 days for reevaluation is appropriate at this time, they advised they will return immediately if new or worsening signs or symptoms present. Patient encouraged to use Benadryl as needed for itch, refrain from sexual activity, return as needed. He verbalized understanding and agreement to today's plan and had no further questions or concerns at time of discharge.        Eyvonne MechanicJeffrey Blaine Hari, PA-C 01/02/15 1210  Geoffery Lyonsouglas Delo, MD 01/02/15 [phone]

## 2015-01-02 NOTE — ED Notes (Addendum)
Pt complains of groin swelling that started two days ago.  Pt reports welling to groin and tip of penis.  Denies discharge.  States that the swollen area in his groin is painful, the swollen area on his penis itches.

## 2015-01-03 LAB — RPR: RPR Ser Ql: NONREACTIVE

## 2015-01-03 LAB — HIV ANTIBODY (ROUTINE TESTING W REFLEX): HIV Screen 4th Generation wRfx: NONREACTIVE

## 2015-01-04 LAB — GC/CHLAMYDIA PROBE AMP (~~LOC~~) NOT AT ARMC
Chlamydia: NEGATIVE
Neisseria Gonorrhea: NEGATIVE

## 2015-01-05 ENCOUNTER — Telehealth (HOSPITAL_COMMUNITY): Payer: Self-pay

## 2015-02-05 ENCOUNTER — Ambulatory Visit (INDEPENDENT_AMBULATORY_CARE_PROVIDER_SITE_OTHER): Payer: Self-pay | Admitting: Family Medicine

## 2015-02-05 ENCOUNTER — Other Ambulatory Visit: Payer: Self-pay | Admitting: Family Medicine

## 2015-02-05 ENCOUNTER — Encounter: Payer: Self-pay | Admitting: Family Medicine

## 2015-02-05 VITALS — BP 127/74 | HR 88 | Temp 98.1°F | Resp 16 | Ht 67.0 in | Wt 179.0 lb

## 2015-02-05 DIAGNOSIS — Z23 Encounter for immunization: Secondary | ICD-10-CM

## 2015-02-05 DIAGNOSIS — Z0000 Encounter for general adult medical examination without abnormal findings: Secondary | ICD-10-CM

## 2015-02-05 LAB — CBC WITH DIFFERENTIAL/PLATELET
BASOS ABS: 0 10*3/uL (ref 0.0–0.1)
BASOS PCT: 0 % (ref 0–1)
EOS ABS: 0.1 10*3/uL (ref 0.0–0.7)
Eosinophils Relative: 1 % (ref 0–5)
HCT: 44.3 % (ref 39.0–52.0)
Hemoglobin: 15.5 g/dL (ref 13.0–17.0)
LYMPHS ABS: 1.8 10*3/uL (ref 0.7–4.0)
Lymphocytes Relative: 23 % (ref 12–46)
MCH: 31.2 pg (ref 26.0–34.0)
MCHC: 35 g/dL (ref 30.0–36.0)
MCV: 89.1 fL (ref 78.0–100.0)
MONO ABS: 0.5 10*3/uL (ref 0.1–1.0)
MONOS PCT: 6 % (ref 3–12)
MPV: 9.5 fL (ref 8.6–12.4)
NEUTROS ABS: 5.5 10*3/uL (ref 1.7–7.7)
NEUTROS PCT: 70 % (ref 43–77)
PLATELETS: 211 10*3/uL (ref 150–400)
RBC: 4.97 MIL/uL (ref 4.22–5.81)
RDW: 13.4 % (ref 11.5–15.5)
WBC: 7.9 10*3/uL (ref 4.0–10.5)

## 2015-02-05 LAB — COMPLETE METABOLIC PANEL WITH GFR
ALT: 13 U/L (ref 9–46)
AST: 23 U/L (ref 10–40)
Albumin: 4.3 g/dL (ref 3.6–5.1)
Alkaline Phosphatase: 77 U/L (ref 40–115)
BILIRUBIN TOTAL: 0.9 mg/dL (ref 0.2–1.2)
BUN: 12 mg/dL (ref 7–25)
CO2: 27 mmol/L (ref 20–31)
CREATININE: 1.24 mg/dL (ref 0.60–1.35)
Calcium: 9.4 mg/dL (ref 8.6–10.3)
Chloride: 102 mmol/L (ref 98–110)
GFR, EST NON AFRICAN AMERICAN: 72 mL/min (ref >=60)
GFR, Est African American: 84 mL/min (ref >=60)
GLUCOSE: 78 mg/dL (ref 65–99)
Potassium: 4.6 mmol/L (ref 3.5–5.3)
SODIUM: 139 mmol/L (ref 135–146)
Total Protein: 6.8 g/dL (ref 6.1–8.1)

## 2015-02-05 LAB — LIPID PANEL
CHOL/HDL RATIO: 4 ratio (ref <=5.0)
Cholesterol: 190 mg/dL (ref 125–200)
HDL: 47 mg/dL (ref >=40)
LDL Cholesterol: 131 mg/dL — ABNORMAL HIGH (ref <130)
Triglycerides: 61 mg/dL (ref <150)
VLDL: 12 mg/dL (ref <30)

## 2015-02-05 LAB — POCT URINALYSIS DIP (DEVICE)
BILIRUBIN URINE: NEGATIVE
Glucose, UA: NEGATIVE mg/dL
Hgb urine dipstick: NEGATIVE
Ketones, ur: NEGATIVE mg/dL
LEUKOCYTES UA: NEGATIVE
NITRITE: NEGATIVE
Protein, ur: NEGATIVE mg/dL
Specific Gravity, Urine: 1.02 (ref 1.005–1.030)
Urobilinogen, UA: 1 mg/dL (ref 0.0–1.0)
pH: 6.5 (ref 5.0–8.0)

## 2015-02-05 NOTE — Progress Notes (Signed)
Subjective:    Patient ID: Jeremiah [name], male    DOB: 07/01/1975, 39 y.o.   MRN: [id number]  HPI Mr. Jeremiah [name], a 39 year old male presents to establish care. He states that he has not had a primary provider due to insurance constraints. He states that he was evaluated in the emergency department for swelling of he penis. Patient states that swelling to penis as dissipated. He has a remote history of sexually transmitted disease but all tests were negative. He maintains that he has been having protected sexual intercourse. Jeremiah [name] states that he feels well and is without complaint. He states that he has a history of sickle cell anemia. He is unsure of type. He states that he has periodic joint pains. He has not been evaluated for sickle cell anemia as an adult.  He states that it has been several years since last dental and eye examination. He has never had a digital rectal examination. He also denies performing monthly self testicular examinations. He denies fever, fatigue, chest pains, shortness of breath, urinary problems, muscle aches, nausea, vomiting, or diarrhea.  Past Medical History  Diagnosis Date  . Anemia   . Asthma     as a child    Social History   Social History  . Marital Status: Single    Spouse Name: N/A  . Number of Children: N/A  . Years of Education: N/A   Occupational History  . Not on file.   Social History Main Topics  . Smoking status: Current Every Day Smoker -- 0.50 packs/day    Types: Cigarettes  . Smokeless tobacco: Never Used  . Alcohol Use: 9.0 oz/week    15 Cans of beer per week  . Drug Use: No  . Sexual Activity: Not on file   Other Topics Concern  . Not on file   Social History Narrative    Immunization History  Administered Date(s) Administered  . Pneumococcal Polysaccharide-23 02/05/2015   Review of Systems  Constitutional: Negative.  Negative for fever, fatigue and unexpected weight change.  HENT: Negative.  Negative for postnasal  drip.   Respiratory: Negative.   Cardiovascular: Negative.   Gastrointestinal: Negative.  Negative for diarrhea, constipation and rectal pain.  Endocrine: Negative.  Negative for polydipsia, polyphagia and polyuria.  Genitourinary: Negative.   Musculoskeletal: Negative.   Skin: Negative.   Allergic/Immunologic: Negative.  Negative for immunocompromised state.  Neurological: Negative.   Hematological: Negative.   Psychiatric/Behavioral: Negative.  Negative for suicidal ideas and sleep disturbance.       Objective:   Physical Exam  Constitutional: He is oriented to person, place, and time. He appears well-developed and well-nourished.  HENT:  Head: Normocephalic and atraumatic.  Right Ear: External ear normal.  Left Ear: External ear normal.  Mouth/Throat: Oropharynx is clear and moist.  Eyes: Conjunctivae and EOM are normal. Pupils are equal, round, and reactive to light.  Neck: Normal range of motion. Neck supple.  Cardiovascular: Normal rate, regular rhythm, normal heart sounds and intact distal pulses.   Pulmonary/Chest: Effort normal and breath sounds normal.  Abdominal: Soft. Bowel sounds are normal.  Genitourinary: Rectum normal, prostate normal and penis normal.  Musculoskeletal: Normal range of motion.  Neurological: He is alert and oriented to person, place, and time.  Skin: Skin is warm and dry.  Psychiatric: He has a normal mood and affect. His behavior is normal. Judgment and thought content normal.     BP 127/74 mmHg  Pulse 88  Temp(Src) 98.1 F (36.7 C) (Oral)  Resp 16  Ht 5\' 7"  (1.702 [name])  Wt 179 lb (81.194 kg)  BMI 28.03 kg/m2 Assessment & Plan:  1. Annual physical exam Recommend dental visits twice annually  Recommend yearly eye exams  Recommend monthly self testicular examinations Digital rectal exam normal Patient maintains that he had an HIV test 2 weeks ago that was negative. Recommend barrier protection with sexual intercourse Recommend a lowfat,  low carbohydrate diet divided over 5-6 small meals, increase water intake to 6-8 glasses, and 150 minutes per week of cardiovascular exercise.   - Pneumococcal polysaccharide vaccine 23-valent greater than or equal to 2yo subcutaneous/IM - POCT urinalysis dipstick - CBC with Differential - COMPLETE METABOLIC PANEL WITH GFR - TSH - Hemoglobin A1c - Lipid Panel  2. Immunization due - Pneumococcal polysaccharide vaccine 23-valent greater than or equal to 2yo subcutaneous/IM   RTC: 1 year for annual physical or routine health maintenance.  Jeremiah [name],Jeremiah Yonker M, FNP

## 2015-02-05 NOTE — Patient Instructions (Signed)

## 2015-02-06 DIAGNOSIS — Z0000 Encounter for general adult medical examination without abnormal findings: Secondary | ICD-10-CM | POA: Insufficient documentation

## 2015-02-06 DIAGNOSIS — Z23 Encounter for immunization: Secondary | ICD-10-CM | POA: Insufficient documentation

## 2015-02-06 LAB — HEMOGLOBIN A1C
Hgb A1c MFr Bld: 5.7 % — ABNORMAL HIGH (ref <5.7)
MEAN PLASMA GLUCOSE: 117 mg/dL — AB (ref <117)

## 2015-02-06 LAB — TSH: TSH: 0.98 u[IU]/mL (ref 0.350–4.500)

## 2015-02-12 LAB — HEMOGLOBINOPATHY EVALUATION
HEMOGLOBIN OTHER: 0 %
HGB A2 QUANT: 3.3 % — AB (ref 2.2–3.2)
HGB A: 56.4 % — AB (ref 96.8–97.8)
HGB S QUANTITAION: 39.8 % — AB
Hgb F Quant: 0.5 % (ref 0.0–2.0)

## 2015-02-15 ENCOUNTER — Telehealth: Payer: Self-pay

## 2015-02-15 ENCOUNTER — Other Ambulatory Visit: Payer: Self-pay

## 2015-02-15 NOTE — Telephone Encounter (Signed)
Called left message advising patient of sickle cell trait. Asked if any questions to call back to our office and left call back number. Thanks!

## 2015-02-15 NOTE — Telephone Encounter (Signed)
-----   Message from Massie MaroonLachina M Hollis, [place] sent at 02/15/2015  8:14 AM EST ----- Please inform patient that his sickle cell test indicates that he has the sickle cell trait. If he has any questions or concerns we will discuss during his follow up appointment.   Thanks!    ----- Message -----    From: [facility]    Sent: 02/12/2015  10:26 AM      To: Massie MaroonLachina M Hollis, FNP

## 2015-03-22 ENCOUNTER — Encounter (HOSPITAL_COMMUNITY): Payer: Self-pay | Admitting: *Deleted

## 2015-03-22 ENCOUNTER — Emergency Department (HOSPITAL_COMMUNITY)
Admission: EM | Admit: 2015-03-22 | Discharge: 2015-03-22 | Disposition: A | Discharge status: Home / Self Care | Payer: Self-pay | Attending: Emergency Medicine | Admitting: Emergency Medicine

## 2015-03-22 DIAGNOSIS — Z862 Personal history of diseases of the blood and blood-forming organs and certain disorders involving the immune mechanism: Secondary | ICD-10-CM | POA: Insufficient documentation

## 2015-03-22 DIAGNOSIS — Z139 Encounter for screening, unspecified: Secondary | ICD-10-CM

## 2015-03-22 DIAGNOSIS — Z008 Encounter for other general examination: Secondary | ICD-10-CM | POA: Insufficient documentation

## 2015-03-22 DIAGNOSIS — J45909 Unspecified asthma, uncomplicated: Secondary | ICD-10-CM | POA: Insufficient documentation

## 2015-03-22 DIAGNOSIS — Z880 Allergy status to penicillin: Secondary | ICD-10-CM | POA: Insufficient documentation

## 2015-03-22 DIAGNOSIS — F17210 Nicotine dependence, cigarettes, uncomplicated: Secondary | ICD-10-CM | POA: Insufficient documentation

## 2015-03-22 LAB — CBC
HEMATOCRIT: 37.4 % — AB (ref 39.0–52.0)
HEMOGLOBIN: 13.2 g/dL (ref 13.0–17.0)
MCH: 31.4 pg (ref 26.0–34.0)
MCHC: 35.3 g/dL (ref 30.0–36.0)
MCV: 88.8 fL (ref 78.0–100.0)
Platelets: 199 10*3/uL (ref 150–400)
RBC: 4.21 MIL/uL — ABNORMAL LOW (ref 4.22–5.81)
RDW: 13.5 % (ref 11.5–15.5)
WBC: 7.3 10*3/uL (ref 4.0–10.5)

## 2015-03-22 LAB — COMPREHENSIVE METABOLIC PANEL
ALBUMIN: 3.7 g/dL (ref 3.5–5.0)
ALK PHOS: 65 U/L (ref 38–126)
ALT: 15 U/L — ABNORMAL LOW (ref 17–63)
ANION GAP: 6 (ref 5–15)
AST: 24 U/L (ref 15–41)
BILIRUBIN TOTAL: 0.5 mg/dL (ref 0.3–1.2)
BUN: 12 mg/dL (ref 6–20)
CALCIUM: 9.6 mg/dL (ref 8.9–10.3)
CO2: 30 mmol/L (ref 22–32)
Chloride: 106 mmol/L (ref 101–111)
Creatinine, Ser: 1.39 mg/dL — ABNORMAL HIGH (ref 0.61–1.24)
GFR calc Af Amer: >60 mL/min (ref >60)
GLUCOSE: 76 mg/dL (ref 65–99)
Potassium: 4.3 mmol/L (ref 3.5–5.1)
Sodium: 142 mmol/L (ref 135–145)
TOTAL PROTEIN: 6.2 g/dL — AB (ref 6.5–8.1)

## 2015-03-22 LAB — RETICULOCYTES
RBC.: 4.21 MIL/uL — ABNORMAL LOW (ref 4.22–5.81)
RETIC COUNT ABSOLUTE: 67.4 10*3/uL (ref 19.0–186.0)
Retic Ct Pct: 1.6 % (ref 0.4–3.1)

## 2015-03-22 NOTE — ED Notes (Signed)
Pt sts he had an "attack" at work on Friday from his sickle cell.  States he felt weak, had leg pain, and had to leave work.  Pt is concerned that he is anemic, and wants to be sure his body is okay.  Pt states he has improved since Friday but c/o some soreness in his joints (hands, elbows, knees)

## 2015-03-22 NOTE — ED Notes (Signed)
Pt able to ambulate independently 

## 2015-03-22 NOTE — ED Notes (Signed)
Pt states that he had a sickle cell attack on Friday. 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

## 2015-03-22 NOTE — ED Provider Notes (Signed)
CSN: [id number]     Arrival date & time 03/22/15  1831 History   First MD Initiated Contact with Patient 03/22/15 1909     Chief Complaint  Patient presents with  . blood work      (Consider location/radiation/quality/duration/timing/severity/associated sxs/prior Treatment) HPI Comments: Patient presents to the emergency department with chief complaint of request for blood work. He states that he had a sickle cell crisis 3 days ago. States that he has still felt weak, but that his pain has improved. He is concerned that he may be more anemic than normal. This is the reason that he came to the ED is to have his blood test. He states that he still has some left upper extremity pain and left leg pain. Pain is mostly in his joints. He states he is significantly improved since Friday. He denies any chest pain or shortness of breath. There are no aggravating or relieving factors. He states that he was going to go to the sickle cell clinic today, but they were closed. Denies other complaints.  The history is provided by the patient. No language interpreter was used.    Past Medical History  Diagnosis Date  . Anemia   . Asthma     as a child    History reviewed. No pertinent past surgical history. Family History  Problem Relation Age of Onset  . Diabetes Father   . Hypertension Father   . Diabetes Brother   . Hypertension Brother   . Diabetes Paternal Aunt   . Hypertension Paternal Aunt   . Diabetes Paternal Grandmother    Social History  Substance Use Topics  . Smoking status: Current Every Day Smoker -- 0.50 packs/day    Types: Cigarettes  . Smokeless tobacco: Never Used  . Alcohol Use: 9.0 oz/week    15 Cans of beer per week    Review of Systems  Constitutional: Negative for fever and chills.  Respiratory: Negative for shortness of breath.   Cardiovascular: Negative for chest pain.  Gastrointestinal: Negative for nausea, vomiting, diarrhea and constipation.  Genitourinary:  Negative for dysuria.  Musculoskeletal: Positive for arthralgias.  All other systems reviewed and are negative.     Allergies  Amoxicillin and Penicillins  Home Medications   Prior to Admission medications   Not on File   BP 141/94 mmHg  Pulse 74  Temp(Src) 98.1 F (36.7 C) (Oral)  Resp 16  SpO2 98% Physical Exam  Constitutional: He is oriented to person, place, and time. He appears well-developed and well-nourished.  HENT:  Head: Normocephalic and atraumatic.  Eyes: Conjunctivae and EOM are normal. Pupils are equal, round, and reactive to light. Right eye exhibits no discharge. Left eye exhibits no discharge. No scleral icterus.  Neck: Normal range of motion. Neck supple. No JVD present.  Cardiovascular: Normal rate, regular rhythm and normal heart sounds.  Exam reveals no gallop and no friction rub.   No murmur heard. Pulmonary/Chest: Effort normal and breath sounds normal. No respiratory distress. He has no wheezes. He has no rales. He exhibits no tenderness.  Abdominal: Soft. He exhibits no distension and no mass. There is no tenderness. There is no rebound and no guarding.  Musculoskeletal: Normal range of motion. He exhibits no edema or tenderness.  Neurological: He is alert and oriented to person, place, and time.  Skin: Skin is warm and dry.  Psychiatric: He has a normal mood and affect. His behavior is normal. Judgment and thought content normal.  Nursing note and  vitals reviewed.   ED Course  Procedures (including critical care time) Results for orders placed or performed during the hospital encounter of 03/22/15  CBC  Result Value Ref Range   WBC 7.3 4.0 - 10.5 K/uL   RBC 4.21 (L) 4.22 - 5.81 MIL/uL   Hemoglobin 13.2 13.0 - 17.0 g/dL   HCT 40.9 (L) 81.1 - 91.4 %   MCV 88.8 78.0 - 100.0 fL   MCH 31.4 26.0 - 34.0 pg   MCHC 35.3 30.0 - 36.0 g/dL   RDW 78.2 95.6 - 21.3 %   Platelets 199 150 - 400 K/uL  Comprehensive metabolic panel  Result Value Ref Range    Sodium 142 135 - 145 mmol/L   Potassium 4.3 3.5 - 5.1 mmol/L   Chloride 106 101 - 111 mmol/L   CO2 30 22 - 32 mmol/L   Glucose, Bld 76 65 - 99 mg/dL   BUN 12 6 - 20 mg/dL   Creatinine, Ser 0.86 (H) 0.61 - 1.24 mg/dL   Calcium 9.6 8.9 - 57.8 mg/dL   Total Protein 6.2 (L) 6.5 - 8.1 g/dL   Albumin 3.7 3.5 - 5.0 g/dL   AST 24 15 - 41 U/L   ALT 15 (L) 17 - 63 U/L   Alkaline Phosphatase 65 38 - 126 U/L   Total Bilirubin 0.5 0.3 - 1.2 mg/dL   GFR calc non Af Amer >60 >60 mL/min   GFR calc Af Amer >60 >60 mL/min   Anion gap 6 5 - 15  Reticulocytes  Result Value Ref Range   Retic Ct Pct 1.6 0.4 - 3.1 %   RBC. 4.21 (L) 4.22 - 5.81 MIL/uL   Retic Count, Manual 67.4 19.0 - 186.0 K/uL   No results found.  I have personally reviewed and evaluated these images and lab results as part of my medical decision-making.   MDM   Final diagnoses:  Encounter for medical screening examination    Blood work is reassuring. Labs reviewed and discussed with Dr. Adriana Simas, who agrees the patient can be discharged. Recommend primary care follow-up in one week.    Roxy Horseman, PA-C 03/22/15 2105  Donnetta Hutching, MD 03/22/15 2217

## 2015-03-22 NOTE — Discharge Instructions (Signed)
Today your Hemoglobin is 13.2 which is normal.  You electrolytes and kidney function are at their baseline.  Please follow-up with your doctor in 1 week.

## 2015-04-12 ENCOUNTER — Encounter (HOSPITAL_COMMUNITY): Payer: Self-pay

## 2015-04-12 ENCOUNTER — Emergency Department (HOSPITAL_COMMUNITY)
Admission: EM | Admit: 2015-04-12 | Discharge: 2015-04-12 | Disposition: A | Discharge status: Home / Self Care | Payer: Self-pay | Attending: Emergency Medicine | Admitting: Emergency Medicine

## 2015-04-12 DIAGNOSIS — S3992XA Unspecified injury of lower back, initial encounter: Secondary | ICD-10-CM | POA: Insufficient documentation

## 2015-04-12 DIAGNOSIS — Z880 Allergy status to penicillin: Secondary | ICD-10-CM | POA: Insufficient documentation

## 2015-04-12 DIAGNOSIS — M25512 Pain in left shoulder: Secondary | ICD-10-CM

## 2015-04-12 DIAGNOSIS — Y998 Other external cause status: Secondary | ICD-10-CM | POA: Insufficient documentation

## 2015-04-12 DIAGNOSIS — J45909 Unspecified asthma, uncomplicated: Secondary | ICD-10-CM | POA: Insufficient documentation

## 2015-04-12 DIAGNOSIS — X58XXXA Exposure to other specified factors, initial encounter: Secondary | ICD-10-CM | POA: Insufficient documentation

## 2015-04-12 DIAGNOSIS — Y92009 Unspecified place in unspecified non-institutional (private) residence as the place of occurrence of the external cause: Secondary | ICD-10-CM | POA: Insufficient documentation

## 2015-04-12 DIAGNOSIS — Y93E5 Activity, floor mopping and cleaning: Secondary | ICD-10-CM | POA: Insufficient documentation

## 2015-04-12 DIAGNOSIS — Z862 Personal history of diseases of the blood and blood-forming organs and certain disorders involving the immune mechanism: Secondary | ICD-10-CM | POA: Insufficient documentation

## 2015-04-12 DIAGNOSIS — F17210 Nicotine dependence, cigarettes, uncomplicated: Secondary | ICD-10-CM | POA: Insufficient documentation

## 2015-04-12 DIAGNOSIS — S161XXA Strain of muscle, fascia and tendon at neck level, initial encounter: Secondary | ICD-10-CM | POA: Insufficient documentation

## 2015-04-12 DIAGNOSIS — S4992XA Unspecified injury of left shoulder and upper arm, initial encounter: Secondary | ICD-10-CM | POA: Insufficient documentation

## 2015-04-12 MED ORDER — METHOCARBAMOL 500 MG PO TABS: 500.0000 mg | ORAL_TABLET | Freq: Two times a day (BID) | ORAL | Status: DC | PRN

## 2015-04-12 MED ORDER — NAPROXEN 500 MG PO TABS: 500.0000 mg | ORAL_TABLET | Freq: Two times a day (BID) | ORAL | Status: DC

## 2015-04-12 MED ORDER — KETOROLAC TROMETHAMINE 60 MG/2ML IM SOLN
30.0000 mg | Freq: Once | INTRAMUSCULAR | Status: AC
  Administered 2015-04-12: 30 mg via INTRAMUSCULAR
  Filled 2015-04-12: qty 2

## 2015-04-12 NOTE — ED Notes (Signed)
Patient here with posterior \\neck  and back stiffness, denies trauma other than cleaning the house. ambulatory

## 2015-04-12 NOTE — ED Provider Notes (Signed)
CSN: [id number]     Arrival date & time 04/12/15  1624 History  By signing my name below, I, Gonzella Lex, attest that this documentation has been prepared under the direction and in the presence of [facility], PA-C. Electronically Signed: Gonzella Lex, Scribe. 04/12/2015. 5:14 PM.   Chief Complaint  Patient presents with  . Neck Pain  . Back Pain   The history is provided by the patient. No language interpreter was used.    HPI Comments: Jeremiah [name] is a 40 y.o. male who presents to the Emergency Department complaining of constant, gradually worsening, left-sided neck pain with radiation into his left shoulder and down the left side of his back, onset two days ago, with worsening pain the past 24 hours. He states that he was cleaning the house two days ago when he felt a sharp pinch in his back. His neck and back pain began shortly after. He denies fever, chest pain, SOB, abdominal pain.   Past Medical History  Diagnosis Date  . Anemia   . Asthma     as a child    History reviewed. No pertinent past surgical history. Family History  Problem Relation Age of Onset  . Diabetes Father   . Hypertension Father   . Diabetes Brother   . Hypertension Brother   . Diabetes Paternal Aunt   . Hypertension Paternal Aunt   . Diabetes Paternal Grandmother    Social History  Substance Use Topics  . Smoking status: Current Every Day Smoker -- 0.50 packs/day    Types: Cigarettes  . Smokeless tobacco: Never Used  . Alcohol Use: 9.0 oz/week    15 Cans of beer per week    Review of Systems  Constitutional: Negative for fever.  Respiratory: Negative for shortness of breath.   Cardiovascular: Negative for chest pain.  Gastrointestinal: Negative for abdominal pain.  Musculoskeletal: Positive for back pain and neck pain.   Allergies  Amoxicillin and Penicillins  Home Medications   Prior to Admission medications   Medication Sig Start Date End Date Taking? Authorizing  Provider  methocarbamol (ROBAXIN) 500 MG tablet Take 1 tablet (500 mg total) by mouth 2 (two) times daily as needed for muscle spasms. 04/12/15   Chase Picket Nyair Depaulo, PA-C  naproxen (NAPROSYN) 500 MG tablet Take 1 tablet (500 mg total) by mouth 2 (two) times daily. 04/12/15   Francesco Provencal Pilcher Ayodele Hartsock, PA-C   BP 140/90 mmHg  Pulse 89  Temp(Src) 99 F (37.2 C) (Oral)  Resp 18  SpO2 100% Physical Exam  Constitutional: He is oriented to person, place, and time. He appears well-developed and well-nourished. No distress.  NAD  HENT:  Head: Normocephalic and atraumatic.  Eyes: Conjunctivae are normal.  Neck: No tracheal deviation present. No Brudzinski's sign and no Kernig's sign noted.    Full ROM, pain of left-side neck with flexion.  No midline tenderness TTP as depicted in image.   Cardiovascular: Normal rate, regular rhythm, normal heart sounds and intact distal pulses.  Exam reveals no gallop and no friction rub.   No murmur heard. Pulmonary/Chest: Effort normal and breath sounds normal. No respiratory distress. He has no wheezes. He has no rales.  Abdominal: Soft. He exhibits no distension. There is no tenderness.  Musculoskeletal:       Arms: Gait is not antalgic; patient is able to ambulate without difficulty. No noted deformities or signs of inflammation. Curvature of cervical, thoracic, and lumbar spine within normal limits. No midline tenderness;  TTP as depicted in image. Decreased ROM with flexion of left shoulder 2/2 pain.  5/5 muscle strength in all four extremities.  Neurological: He is alert and oriented to person, place, and time.  Bilateral lower extremities neurovascularly intact.  Skin: Skin is warm and dry. No rash noted. No erythema.  Psychiatric: He has a normal mood and affect.  Nursing note and vitals reviewed.  ED Course  Procedures  DIAGNOSTIC STUDIES:    Oxygen Saturation is 100% on RA, normal by my interpretation.   COORDINATION OF CARE:  5:02 PM Will  administer Toradol in the ED. Will prescribe Naproxen and Robaxin. Discussed treatment plan with pt at bedside and pt agreed to plan.   MDM   Final diagnoses:  Neck strain, initial encounter  Shoulder pain, acute, left    Abhijay R Huckeba presents with left neck/shoulder/back pain c/w musculoskeletal etiology. No meningeal signs. Afebrile, VSS, non-toxic appearing. Will give toradol IM here; robaxin and naproxen rx for home. Patient instructed on medications and side effects. Ortho referral is symptoms continue. Return precautions given.   I personally performed the services described in this documentation, which was scribed in my presence. The recorded information has been reviewed and is accurate.  [facility] Yukari Flax, PA-C 04/12/15 1804  Vanetta Mulders, MD 04/13/15 [phone]

## 2015-04-12 NOTE — ED Notes (Signed)
Declined W/C at D/C and was escorted to lobby by RN. 

## 2015-04-12 NOTE — Discharge Instructions (Signed)
1. Medications: Take naproxen as directed for the next 3-4 days, then as needed for pain; Robaxin is your muscle relaxer - This can make you very drowsy - please do not drink or drive on this medication - this is best taken before bed for pain relief and help getting a good night of sleep; continue usual home medications 2. Treatment: rest, drink plenty of fluids, ice affected area as needed for pain relief 3. Follow Up: Please follow up with the orthopedic clinic listed if symptoms do not improve after 1 weel for discussion of your diagnoses and further evaluation after today's visit; Please return to the ER for any new or worsening symptoms, any additional concerns.

## 2015-05-06 ENCOUNTER — Emergency Department (HOSPITAL_COMMUNITY): Payer: Self-pay

## 2015-05-06 ENCOUNTER — Encounter (HOSPITAL_COMMUNITY): Payer: Self-pay | Admitting: Emergency Medicine

## 2015-05-06 ENCOUNTER — Emergency Department (HOSPITAL_COMMUNITY)
Admission: EM | Admit: 2015-05-06 | Discharge: 2015-05-07 | Disposition: A | Discharge status: Home / Self Care | Payer: Self-pay | Attending: Emergency Medicine | Admitting: Emergency Medicine

## 2015-05-06 DIAGNOSIS — J45909 Unspecified asthma, uncomplicated: Secondary | ICD-10-CM | POA: Insufficient documentation

## 2015-05-06 DIAGNOSIS — M5412 Radiculopathy, cervical region: Secondary | ICD-10-CM | POA: Insufficient documentation

## 2015-05-06 DIAGNOSIS — Z862 Personal history of diseases of the blood and blood-forming organs and certain disorders involving the immune mechanism: Secondary | ICD-10-CM | POA: Insufficient documentation

## 2015-05-06 DIAGNOSIS — Z880 Allergy status to penicillin: Secondary | ICD-10-CM | POA: Insufficient documentation

## 2015-05-06 DIAGNOSIS — F17210 Nicotine dependence, cigarettes, uncomplicated: Secondary | ICD-10-CM | POA: Insufficient documentation

## 2015-05-06 LAB — BASIC METABOLIC PANEL
ANION GAP: 11 (ref 5–15)
BUN: 15 mg/dL (ref 6–20)
CHLORIDE: 107 mmol/L (ref 101–111)
CO2: 23 mmol/L (ref 22–32)
Calcium: 9.5 mg/dL (ref 8.9–10.3)
Creatinine, Ser: 1.3 mg/dL — ABNORMAL HIGH (ref 0.61–1.24)
GFR calc Af Amer: >60 mL/min (ref >60)
GLUCOSE: 97 mg/dL (ref 65–99)
POTASSIUM: 3.9 mmol/L (ref 3.5–5.1)
Sodium: 141 mmol/L (ref 135–145)

## 2015-05-06 LAB — CBC
HEMATOCRIT: 41.9 % (ref 39.0–52.0)
HEMOGLOBIN: 14.7 g/dL (ref 13.0–17.0)
MCH: 31.1 pg (ref 26.0–34.0)
MCHC: 35.1 g/dL (ref 30.0–36.0)
MCV: 88.6 fL (ref 78.0–100.0)
Platelets: 197 10*3/uL (ref 150–400)
RBC: 4.73 MIL/uL (ref 4.22–5.81)
RDW: 13.2 % (ref 11.5–15.5)
WBC: 8.9 10*3/uL (ref 4.0–10.5)

## 2015-05-06 LAB — I-STAT TROPONIN, ED: Troponin i, poc: 0 ng/mL (ref 0.00–0.08)

## 2015-05-06 NOTE — ED Notes (Signed)
Pt c/o left sided CP into arm and neck into back x 3 days worse with inspiration

## 2015-05-06 NOTE — ED Provider Notes (Signed)
CSN: [id number]     Arrival date & time 05/06/15  1828 History   By signing my name below, I, Jeremiah [name], attest that this documentation has been prepared under the direction and in the presence of [name], MD.  Electronically Signed: Arlan [name], ED Scribe. 05/06/2015. 11:51 PM.   Chief Complaint  Patient presents with  . Chest Pain   The history is provided by the patient. No language interpreter was used.    HPI Comments: Jeremiah [name] is a 40 y.o. male without any pertinent past medical history who presents to the Emergency Department complaining of intermittent, ongoing, worsening L shoulder pain that radiates into the L chest and down the L arm x 5 days. Pain is described as achy/throbbing and rated 10/10 at highest intensity. He also reports some numbness to the L thumb and forearm. No recent falls, injury, or heavy lifting. 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 Discomfort is exacerbated with certain positioning and states discomfort is mildly alleviated when lying down. Prescribed Robaxin and Naproxen attempted at home with mild temporary improvement. No recent fever, chills, nausea, or vomiting. [name] was evaluated on 1/23 for similar and was safely discharged home with prescribed medications.  PCP: No PCP Per Patient    Past Medical History  Diagnosis Date  . Anemia   . Asthma     as a child    History reviewed. No pertinent past surgical history. Family History  Problem Relation Age of Onset  . Diabetes Father   . Hypertension Father   . Diabetes Brother   . Hypertension Brother   . Diabetes Paternal Aunt   . Hypertension Paternal Aunt   . Diabetes Paternal Grandmother    Social History  Substance Use Topics  . Smoking status: Current Every Day Smoker -- 0.50 packs/day    Types: Cigarettes  . Smokeless tobacco: Never Used  . Alcohol Use: 9.0 oz/week    15 Cans of beer per week    Review of  Systems  Constitutional: Negative for fever and chills.  Respiratory: Negative for cough.   Cardiovascular: Positive for chest pain.  Gastrointestinal: Negative for nausea and vomiting.  Musculoskeletal: Positive for arthralgias.  Neurological: Positive for numbness. Negative for headaches.  Psychiatric/Behavioral: Negative for confusion.  All other systems reviewed and are negative.     Allergies  Amoxicillin and Penicillins  Home Medications   Prior to Admission medications   Medication Sig Start Date End Date Taking? Authorizing Provider  methocarbamol (ROBAXIN) 500 MG tablet Take 1 tablet (500 mg total) by mouth 2 (two) times daily as needed for muscle spasms. 04/12/15   Chase Picket Ward, PA-C  naproxen (NAPROSYN) 500 MG tablet Take 1 tablet (500 mg total) by mouth 2 (two) times daily. 04/12/15   Chase Picket Ward, PA-C   Triage Vitals: BP 133/88 mmHg  Pulse 70  Temp(Src) 98 F (36.7 C) (Oral)  Resp 14  SpO2 98%   Physical Exam  Constitutional: He is oriented to person, place, and time. He appears well-developed and well-nourished.  HENT:  Head: Normocephalic and atraumatic.  Eyes: EOM are normal. Pupils are equal, round, and reactive to light.  Neck: Normal range of motion. Neck supple. No JVD present.  Mild tenderness in lower cervical spine   Cardiovascular: Normal rate, regular rhythm, normal heart sounds and intact distal pulses.   Pulmonary/Chest: Effort normal and breath sounds normal. He has no wheezes. He has  no rales. He exhibits no tenderness.  Abdominal: Soft. Bowel sounds are normal. He exhibits no distension and no mass. There is no tenderness.  Musculoskeletal: Normal range of motion. He exhibits tenderness.  Moderate tenderness to L anterior deltoid groove with some tenderness expending into L pectoralis  muscle L rotator cuff impingement signs are present  Lymphadenopathy:    He has no cervical adenopathy.  Neurological: He is alert and oriented to  person, place, and time. No cranial nerve deficit. He exhibits normal muscle tone. Coordination normal.  Decreased sensation to the L thumb and radial aspect of L forearm  Mild weakness to L pincer grasp 4/5 L shoulder abduction stregnth 3+/5 Very mild weakness of L elbow extension and flexion 4/5  Skin: Skin is warm and dry. No rash noted.  Psychiatric: He has a normal mood and affect. His behavior is normal. Judgment and thought content normal.  Nursing note and vitals reviewed.   ED Course  Procedures (including critical care time)  DIAGNOSTIC STUDIES: Oxygen S98aturation is RA% on RA, Normal by my interpretation.    COORDINATION OF CARE: 11:37 PM- Will order CXR, CT cervical spine without contrast, DG shoulder L. BMP, CBC, i-stat troponin, and EKG. Discussed treatment plan with pt at bedside and pt agreed to plan.     Labs Review Results for orders placed or performed during the hospital encounter of 05/06/15  Basic metabolic panel  Result Value Ref Range   Sodium 141 135 - 145 mmol/L   Potassium 3.9 3.5 - 5.1 mmol/L   Chloride 107 101 - 111 mmol/L   CO2 23 22 - 32 mmol/L   Glucose, Bld 97 65 - 99 mg/dL   BUN 15 6 - 20 mg/dL   Creatinine, Ser 1.61 (H) 0.61 - 1.24 mg/dL   Calcium 9.5 8.9 - 09.6 mg/dL   GFR calc non Af Amer >60 >60 mL/min   GFR calc Af Amer >60 >60 mL/min   Anion gap 11 5 - 15  CBC  Result Value Ref Range   WBC 8.9 4.0 - 10.5 K/uL   RBC 4.73 4.22 - 5.81 MIL/uL   Hemoglobin 14.7 13.0 - 17.0 g/dL   HCT 04.5 40.9 - 81.1 %   MCV 88.6 78.0 - 100.0 fL   MCH 31.1 26.0 - 34.0 pg   MCHC 35.1 30.0 - 36.0 g/dL   RDW 91.4 78.2 - 95.6 %   Platelets 197 150 - 400 K/uL  I-stat troponin, ED (not at [facility], [facility])  Result Value Ref Range   Troponin i, poc 0.00 0.00 - 0.08 ng/mL   Comment 3           Imaging Review Dg Chest 2 View  05/06/2015  CLINICAL DATA:  Left arm pain and numbness EXAM: CHEST  2 VIEW COMPARISON:  06/27/2008 FINDINGS: The heart size and  mediastinal contours are within normal limits. Both lungs are clear. The visualized skeletal structures are unremarkable. IMPRESSION: No active cardiopulmonary disease. Electronically Signed   By: Alcide Clever M.D.   On: 05/06/2015 19:23   Ct Cervical Spine Wo Contrast  05/07/2015  CLINICAL DATA:  Neck pain radiating to LEFT shoulder. No injury. LEFT hand numbness. EXAM: CT CERVICAL SPINE WITHOUT CONTRAST TECHNIQUE: Multidetector CT imaging of the cervical spine was performed without intravenous contrast. Multiplanar CT image reconstructions were also generated. COMPARISON:  None. FINDINGS: Cervical vertebral bodies and posterior elements are intact and aligned with broad reversed cervical lordosis. Moderate C5-6 and C7-T1 disc height loss  with uncovertebral hypertrophy. Moderate RIGHT C2-3 facet arthropathy. C1-2 articulation maintained. No destructive bony lesions. Included prevertebral soft tissues are nonacute. Moderate LEFT C5-6 and C7-T1 neural foraminal narrowing. IMPRESSION: Broad reversed cervical lordosis without acute fracture or malalignment. Degenerative cervical spine resulting in moderate LEFT C5-6 and C7-T1 neural foraminal narrowing. Electronically Signed   By: Awilda Metro M.D.   On: 05/07/2015 01:05   Dg Shoulder Left  05/07/2015  CLINICAL DATA:  Intermittent worsening LEFT shoulder pain radiating to LEFT arm for 5 days. EXAM: LEFT SHOULDER - 2+ VIEW COMPARISON:  None. FINDINGS: The humeral head is well-formed and located. The subacromial, glenohumeral and acromioclavicular joint spaces are intact. No destructive bony lesions. Soft tissue planes are non-suspicious. IMPRESSION: Negative. Electronically Signed   By: Awilda Metro M.D.   On: 05/07/2015 01:07   I have personally reviewed and evaluated these images and lab results as part of my medical decision-making.   EKG Interpretation   Date/Time:  Thursday May 06 2015 18:39:10 EST Ventricular Rate:  75 PR Interval:   158 QRS Duration: 78 QT Interval:  374 QTC Calculation: 417 R Axis:   78 Text Interpretation:  Normal sinus rhythm Artifact Normal ECG No old  tracing to compare Confirmed by [facility]  MD, Cristine Daw ([id number]) on 05/06/2015  11:29:17 PM      MDM   Final diagnoses:  Cervical radiculopathy at C6    Left arm numbness and pain which is present in the past shoulder and neck. There may be 2 separate processes going on, but I feel that most of his symptoms are from a cervical radiculopathy. Based on exam, I suspect a C6 radiculopathy. X-rays are obtained of the left shoulder which are unremarkable. CT of the cervical spine is obtained, showing significant neural foramen stenosis at C5-C6 and also at C7-T1. The foraminal stenosis at C5-C6 correlates with his clinical condition with numbness involving the left thumb and weakness of the left deltoid muscle.old records are reviewed and he does have a recent ED visit for pain in the left arm and chest and back which probably was the same condition.he states that he had been taking ibuprofen but it was causing him GI upset even though he was taking it with food. He will be given a trial on celecoxib and also given prescriptions for orphenadrine and oxycodone-acetaminophen. He is referred to orthopedics for evaluation of his shoulder, and neurosurgery for evaluation of his apparent C6 radiculopathy.  I personally performed the services described in this documentation, which was scribed in my presence. The recorded information has been reviewed and is accurate.      [name], MD 05/07/15 [phone]

## 2015-05-07 ENCOUNTER — Emergency Department (HOSPITAL_COMMUNITY): Payer: Self-pay

## 2015-05-07 MED ORDER — CELECOXIB 200 MG PO CAPS: 200.0000 mg | ORAL_CAPSULE | Freq: Two times a day (BID) | ORAL | Status: DC

## 2015-05-07 MED ORDER — OXYCODONE-ACETAMINOPHEN 5-325 MG PO TABS
1.0000 | ORAL_TABLET | Freq: Once | ORAL | Status: AC
  Administered 2015-05-07: 1 via ORAL
  Filled 2015-05-07: qty 1

## 2015-05-07 MED ORDER — OXYCODONE-ACETAMINOPHEN 5-325 MG PO TABS: 1.0000 | ORAL_TABLET | ORAL | Status: DC | PRN

## 2015-05-07 MED ORDER — ORPHENADRINE CITRATE ER 100 MG PO TB12: 100.0000 mg | ORAL_TABLET | Freq: Two times a day (BID) | ORAL | Status: DC

## 2015-05-07 NOTE — Discharge Instructions (Signed)
See the neurosurgeon regarding your neck pain and numbness. See the orthopedic doctor about your shoulder pain.  Cervical Radiculopathy Cervical radiculopathy happens when a nerve in the neck (cervical nerve) is pinched or bruised. This condition can develop because of an injury or as part of the normal aging process. Pressure on the cervical nerves can cause pain or numbness that runs from the neck all the way down into the arm and fingers. Usually, this condition gets better with rest. Treatment may be needed if the condition does not improve.  CAUSES This condition may be caused by:  Injury.  Slipped (herniated) disk.  Muscle tightness in the neck because of overuse.  Arthritis.  Breakdown or degeneration in the bones and joints of the spine (spondylosis) due to aging.  Bone spurs that may develop near the cervical nerves. SYMPTOMS Symptoms of this condition include:  Pain that runs from the neck to the arm and hand. The pain can be severe or irritating. It may be worse when the neck is moved.  Numbness or weakness in the affected arm and hand. DIAGNOSIS This condition may be diagnosed based on symptoms, medical history, and a physical exam. You may also have tests, including:  X-rays.  CT scan.  MRI.  Electromyogram (EMG).  Nerve conduction tests. TREATMENT In many cases, treatment is not needed for this condition. With rest, the condition usually gets better over time. If treatment is needed, options may include:  Wearing a soft neck collar for short periods of time.  Physical therapy to strengthen your neck muscles.  Medicines, such as NSAIDs, oral corticosteroids, or spinal injections.  Surgery. This may be needed if other treatments do not help. Various types of surgery may be done depending on the cause of your problems. HOME CARE INSTRUCTIONS Managing Pain  Take over-the-counter and prescription medicines only as told by your health care provider.  If  directed, apply ice to the affected area.  Put ice in a plastic bag.  Place a towel between your skin and the bag.  Leave the ice on for 20 minutes, 2-3 times per day.  If ice does not help, you can try using heat. Take a warm shower or warm bath, or use a heat pack as told by your health care provider.  Try a gentle neck and shoulder massage to help relieve symptoms. Activity  Rest as needed. Follow instructions from your health care provider about any restrictions on activities.  Do stretching and strengthening exercises as told by your health care provider or physical therapist. General Instructions  If you were given a soft collar, wear it as told by your health care provider.  Use a flat pillow when you sleep.  Keep all follow-up visits as told by your health care provider. This is important. SEEK MEDICAL CARE IF:  Your condition does not improve with treatment. SEEK IMMEDIATE MEDICAL CARE IF:  Your pain gets much worse and cannot be controlled with medicines.  You have weakness or numbness in your hand, arm, face, or leg.  You have a high fever.  You have a stiff, rigid neck.  You lose control of your bowels or your bladder (have incontinence).  You have trouble with walking, balance, or speaking.   This information is not intended to replace advice given to you by your health care provider. Make sure you discuss any questions you have with your health care provider.   Document Released: 11/29/2000 Document Revised: 11/25/2014 Document Reviewed: 04/30/2014 Elsevier Interactive Patient  Education 2016 [employer]  Shoulder Pain The shoulder is the joint that connects your arms to your body. The bones that form the shoulder joint include the upper arm bone (humerus), the shoulder blade (scapula), and the collarbone (clavicle). The top of the humerus is shaped like a ball and fits into a rather flat socket on the scapula (glenoid cavity). A combination of muscles  and strong, fibrous tissues that connect muscles to bones (tendons) support your shoulder joint and hold the ball in the socket. Small, fluid-filled sacs (bursae) are located in different areas of the joint. They act as cushions between the bones and the overlying soft tissues and help reduce friction between the gliding tendons and the bone as you move your arm. Your shoulder joint allows a wide range of motion in your arm. This range of motion allows you to do things like scratch your back or throw a ball. However, this range of motion also makes your shoulder more prone to pain from overuse and injury. Causes of shoulder pain can originate from both injury and overuse and usually can be grouped in the following four categories:  Redness, swelling, and pain (inflammation) of the tendon (tendinitis) or the bursae (bursitis).  Instability, such as a dislocation of the joint.  Inflammation of the joint (arthritis).  Broken bone (fracture). HOME CARE INSTRUCTIONS   Apply ice to the sore area.  Put ice in a plastic bag.  Place a towel between your skin and the bag.  Leave the ice on for 15-20 minutes, 3-4 times per day for the first 2 days, or as directed by your health care provider.  Stop using cold packs if they do not help with the pain.  If you have a shoulder sling or immobilizer, wear it as long as your caregiver instructs. Only remove it to shower or bathe. Move your arm as little as possible, but keep your hand moving to prevent swelling.  Squeeze a soft ball or foam pad as much as possible to help prevent swelling.  Only take over-the-counter or prescription medicines for pain, discomfort, or fever as directed by your caregiver. SEEK MEDICAL CARE IF:   Your shoulder pain increases, or new pain develops in your arm, hand, or fingers.  Your hand or fingers become cold and numb.  Your pain is not relieved with medicines. SEEK IMMEDIATE MEDICAL CARE IF:   Your arm, hand, or  fingers are numb or tingling.  Your arm, hand, or fingers are significantly swollen or turn white or blue. MAKE SURE YOU:   Understand these instructions.  Will watch your condition.  Will get help right away if you are not doing well or get worse.   This information is not intended to replace advice given to you by your health care provider. Make sure you discuss any questions you have with your health care provider.   Document Released: 12/14/2004 Document Revised: 03/27/2014 Document Reviewed: 06/29/2014 Elsevier Interactive Patient Education 2016 [employer]  Celecoxib capsules What is this medicine? CELECOXIB (sell a KOX ib) is a non-steroidal anti-inflammatory drug (NSAID). This medicine is used to treat arthritis and ankylosing spondylitis. It may be also used for pain or painful monthly periods. This medicine may be used for other purposes; ask your health care provider or pharmacist if you have questions. What should I tell my health care provider before I take this medicine? They need to know if you have any of these conditions: -asthma -coronary artery bypass graft (CABG) surgery  within the past 2 weeks -drink more than 3 alcohol-containing drinks a day -heart disease or circulation problems like heart failure or leg edema (fluid retention) -high blood pressure -kidney disease -liver disease -stomach bleeding or ulcers -an unusual or allergic reaction to celecoxib, sulfa drugs, aspirin, other NSAIDs, other medicines, foods, dyes, or preservatives -pregnant or trying to get pregnant -breast-feeding How should I use this medicine? Take this medicine by mouth with a full glass of water. Follow the directions on the prescription label. Take it with food if it upsets your stomach or if you take 400 mg at one time. Try to not lie down for at least 10 minutes after you take the medicine. Take the medicine at the same time each day. Do not take more medicine than you are told  to take. Long-term, continuous use may increase the risk of heart attack or stroke. A special MedGuide will be given to you by the pharmacist with each prescription and refill. Be sure to read this information carefully each time. Talk to your pediatrician regarding the use of this medicine in children. Special care may be needed. Overdosage: If you think you have taken too much of this medicine contact a poison control center or emergency room at once. NOTE: This medicine is only for you. Do not share this medicine with others. What if I miss a dose? If you miss a dose, take it as soon as you can. If it is almost time for your next dose, take only that dose. Do not take double or extra doses. What may interact with this medicine? Do not take this medicine with any of the following medications: -cidofovir -methotrexate -other NSAIDs, medicines for pain and inflammation, like ibuprofen or naproxen -pemetrexed This medicine may also interact with the following medications: -alcohol -aspirin and aspirin-like drugs -diuretics -fluconazole -lithium -medicines for high blood pressure -steroid medicines like prednisone or cortisone -warfarin This list may not describe all possible interactions. Give your health care provider a list of all the medicines, herbs, non-prescription drugs, or dietary supplements you use. Also tell them if you smoke, drink alcohol, or use illegal drugs. Some items may interact with your medicine. What should I watch for while using this medicine? Tell your doctor or health care professional if your pain does not get better. Talk to your doctor before taking another medicine for pain. Do not treat yourself. This medicine does not prevent heart attack or stroke. In fact, this medicine may increase the chance of a heart attack or stroke. The chance may increase with longer use of this medicine and in people who have heart disease. If you take aspirin to prevent heart attack  or stroke, talk with your doctor or health care professional. Do not take medicines such as ibuprofen and naproxen with this medicine. Side effects such as stomach upset, nausea, or ulcers may be more likely to occur. Many medicines available without a prescription should not be taken with this medicine. This medicine can cause ulcers and bleeding in the stomach and intestines at any time during treatment. Ulcers and bleeding can happen without warning symptoms and can cause death. What side effects may I notice from receiving this medicine? Side effects that you should report to your doctor or health care professional as soon as possible: -allergic reactions like skin rash, itching or hives, swelling of the face, lips, or tongue -black or bloody stools, blood in the urine or vomit -blurred vision -breathing problems -chest pain -nausea, vomiting -problems  with balance, talking, walking -redness, blistering, peeling or loosening of the skin, including inside the mouth -unexplained weight gain or swelling -unusually weak or tired -yellowing of eyes, skin Side effects that usually do not require medical attention (report to your doctor or health care professional if they continue or are bothersome): -constipation or diarrhea -dizziness -gas or heartburn -upset stomach This list may not describe all possible side effects. Call your doctor for medical advice about side effects. You may report side effects to FDA at 1-800-FDA-1088. Where should I keep my medicine? Keep out of the reach of children. Store at room temperature between 15 and 30 degrees C (59 and 86 degrees F). Keep container tightly closed. Throw away any unused medicine after the expiration date. NOTE: This sheet is a summary. It may not cover all possible information. If you have questions about this medicine, talk to your doctor, pharmacist, or health care provider.    2016, Elsevier/Gold Standard. (2009-05-05  10:54:17)  Orphenadrine tablets What is this medicine? ORPHENADRINE (or FEN a dreen) helps to relieve pain and stiffness in muscles and can treat muscle spasms. This medicine may be used for other purposes; ask your health care provider or pharmacist if you have questions. What should I tell my health care provider before I take this medicine? They need to know if you have any of these conditions: -glaucoma -heart disease -kidney disease -myasthenia gravis -peptic ulcer disease -prostate disease -stomach problems -an unusual or allergic reaction to orphenadrine, other medicines, foods, lactose, dyes, or preservatives -pregnant or trying to get pregnant -breast-feeding How should I use this medicine? Take this medicine by mouth with a full glass of water. Follow the directions on the prescription label. Take your medicine at regular intervals. Do not take your medicine more often than directed. Do not take more than you are told to take. Talk to your pediatrician regarding the use of this medicine in children. Special care may be needed. Patients over 8 years old may have a stronger reaction and need a smaller dose. Overdosage: If you think you have taken too much of this medicine contact a poison control center or emergency room at once. NOTE: This medicine is only for you. Do not share this medicine with others. What if I miss a dose? If you miss a dose, take it as soon as you can. If it is almost time for your next dose, take only that dose. Do not take double or extra doses. What may interact with this medicine? -alcohol -antihistamines -barbiturates, like phenobarbital -benzodiazepines -cyclobenzaprine -medicines for pain -phenothiazines like chlorpromazine, mesoridazine, prochlorperazine, thioridazine This list may not describe all possible interactions. Give your health care provider a list of all the medicines, herbs, non-prescription drugs, or dietary supplements you use.  Also tell them if you smoke, drink alcohol, or use illegal drugs. Some items may interact with your medicine. What should I watch for while using this medicine? Your mouth may get dry. Chewing sugarless gum or sucking hard candy, and drinking plenty of water may help. Contact your doctor if the problem does not go away or is severe. This medicine may cause dry eyes and blurred vision. If you wear contact lenses you may feel some discomfort. Lubricating drops may help. See your eye doctor if the problem does not go away or is severe. You may get drowsy or dizzy. Do not drive, use machinery, or do anything that needs mental alertness until you know how this medicine affects you. Do not  stand or sit up quickly, especially if you are an older patient. This reduces the risk of dizzy or fainting spells. Alcohol may interfere with the effect of this medicine. Avoid alcoholic drinks. What side effects may I notice from receiving this medicine? Side effects that you should report to your doctor or health care professional as soon as possible: -allergic reactions like skin rash, itching or hives, swelling of the face, lips, or tongue -changes in vision -difficulty breathing -fast heartbeat or palpitations -hallucinations -light headedness, fainting spells -vomiting Side effects that usually do not require medical attention (report to your doctor or health care professional if they continue or are bothersome): -dizziness -drowsiness -headache -nausea This list may not describe all possible side effects. Call your doctor for medical advice about side effects. You may report side effects to FDA at 1-800-FDA-1088. Where should I keep my medicine? Keep out of the reach of children. This medicine may cause accidental overdose and death if it taken by other adults, children, or pets. Mix any unused medicine with a substance like cat litter or coffee grounds. Then throw the medicine away in a sealed container  like a sealed bag or a coffee can with a lid. Do not use the medicine after the expiration date. Store at room temperature between 15 and 30 degrees C (59 and 86 degrees F). NOTE: This sheet is a summary. It may not cover all possible information. If you have questions about this medicine, talk to your doctor, pharmacist, or health care provider.    2016, Elsevier/Gold Standard. (2013-05-02 15:35:08)  Acetaminophen; Oxycodone tablets What is this medicine? ACETAMINOPHEN; OXYCODONE (a set a MEE noe fen; ox i KOE done) is a pain reliever. It is used to treat moderate to severe pain. This medicine may be used for other purposes; ask your health care provider or pharmacist if you have questions. What should I tell my health care provider before I take this medicine? They need to know if you have any of these conditions: -brain tumor -Crohn's disease, inflammatory bowel disease, or ulcerative colitis -drug abuse or addiction -head injury -heart or circulation problems -if you often drink alcohol -kidney disease or problems going to the bathroom -liver disease -lung disease, asthma, or breathing problems -an unusual or allergic reaction to acetaminophen, oxycodone, other opioid analgesics, other medicines, foods, dyes, or preservatives -pregnant or trying to get pregnant -breast-feeding How should I use this medicine? Take this medicine by mouth with a full glass of water. Follow the directions on the prescription label. You can take it with or without food. If it upsets your stomach, take it with food. Take your medicine at regular intervals. Do not take it more often than directed. Talk to your pediatrician regarding the use of this medicine in children. Special care may be needed. Patients over 48 years old may have a stronger reaction and need a smaller dose. Overdosage: If you think you have taken too much of this medicine contact a poison control center or emergency room at once. NOTE:  This medicine is only for you. Do not share this medicine with others. What if I miss a dose? If you miss a dose, take it as soon as you can. If it is almost time for your next dose, take only that dose. Do not take double or extra doses. What may interact with this medicine? -alcohol -antihistamines -barbiturates like amobarbital, butalbital, butabarbital, methohexital, pentobarbital, phenobarbital, thiopental, and secobarbital -benztropine -drugs for bladder problems like solifenacin, trospium, oxybutynin,  tolterodine, hyoscyamine, and methscopolamine -drugs for breathing problems like ipratropium and tiotropium -drugs for certain stomach or intestine problems like propantheline, homatropine methylbromide, glycopyrrolate, atropine, belladonna, and dicyclomine -general anesthetics like etomidate, ketamine, nitrous oxide, propofol, desflurane, enflurane, halothane, isoflurane, and sevoflurane -medicines for depression, anxiety, or psychotic disturbances -medicines for sleep -muscle relaxants -naltrexone -narcotic medicines (opiates) for pain -phenothiazines like perphenazine, thioridazine, chlorpromazine, mesoridazine, fluphenazine, prochlorperazine, promazine, and trifluoperazine -scopolamine -tramadol -trihexyphenidyl This list may not describe all possible interactions. Give your health care provider a list of all the medicines, herbs, non-prescription drugs, or dietary supplements you use. Also tell them if you smoke, drink alcohol, or use illegal drugs. Some items may interact with your medicine. What should I watch for while using this medicine? Tell your doctor or health care professional if your pain does not go away, if it gets worse, or if you have new or a different type of pain. You may develop tolerance to the medicine. Tolerance means that you will need a higher dose of the medication for pain relief. Tolerance is normal and is expected if you take this medicine for a long  time. Do not suddenly stop taking your medicine because you may develop a severe reaction. Your body becomes used to the medicine. This does NOT mean you are addicted. Addiction is a behavior related to getting and using a drug for a non-medical reason. If you have pain, you have a medical reason to take pain medicine. Your doctor will tell you how much medicine to take. If your doctor wants you to stop the medicine, the dose will be slowly lowered over time to avoid any side effects. You may get drowsy or dizzy. Do not drive, use machinery, or do anything that needs mental alertness until you know how this medicine affects you. Do not stand or sit up quickly, especially if you are an older patient. This reduces the risk of dizzy or fainting spells. Alcohol may interfere with the effect of this medicine. Avoid alcoholic drinks. There are different types of narcotic medicines (opiates) for pain. If you take more than one type at the same time, you may have more side effects. Give your health care provider a list of all medicines you use. Your doctor will tell you how much medicine to take. Do not take more medicine than directed. Call emergency for help if you have problems breathing. The medicine will cause constipation. Try to have a bowel movement at least every 2 to 3 days. If you do not have a bowel movement for 3 days, call your doctor or health care professional. Do not take Tylenol (acetaminophen) or medicines that have acetaminophen with this medicine. Too much acetaminophen can be very dangerous. Many nonprescription medicines contain acetaminophen. Always read the labels carefully to avoid taking more acetaminophen. What side effects may I notice from receiving this medicine? Side effects that you should report to your doctor or health care professional as soon as possible: -allergic reactions like skin rash, itching or hives, swelling of the face, lips, or tongue -breathing difficulties,  wheezing -confusion -light headedness or fainting spells -severe stomach pain -unusually weak or tired -yellowing of the skin or the whites of the eyes Side effects that usually do not require medical attention (report to your doctor or health care professional if they continue or are bothersome): -dizziness -drowsiness -nausea -vomiting This list may not describe all possible side effects. Call your doctor for medical advice about side effects. You may report side effects  to FDA at 1-800-FDA-1088. Where should I keep my medicine? Keep out of the reach of children. This medicine can be abused. Keep your medicine in a safe place to protect it from theft. Do not share this medicine with anyone. Selling or giving away this medicine is dangerous and against the law. This medicine may cause accidental overdose and death if it taken by other adults, children, or pets. Mix any unused medicine with a substance like cat litter or coffee grounds. Then throw the medicine away in a sealed container like a sealed bag or a coffee can with a lid. Do not use the medicine after the expiration date. Store at room temperature between 20 and 25 degrees C (68 and 77 degrees F). NOTE: This sheet is a summary. It may not cover all possible information. If you have questions about this medicine, talk to your doctor, pharmacist, or health care provider.    2016, Elsevier/Gold Standard. (2014-02-04 15:18:46)

## 2016-02-04 ENCOUNTER — Ambulatory Visit: Payer: Self-pay | Admitting: Family Medicine

## 2016-08-09 ENCOUNTER — Emergency Department (HOSPITAL_COMMUNITY): Payer: Self-pay

## 2016-08-09 ENCOUNTER — Inpatient Hospital Stay (HOSPITAL_COMMUNITY)
Admission: EM | Admit: 2016-08-09 | Discharge: 2016-08-12 | DRG: 176 | Disposition: A | Discharge status: Home / Self Care | Payer: Self-pay | Attending: Family Medicine | Admitting: Family Medicine

## 2016-08-09 ENCOUNTER — Encounter (HOSPITAL_COMMUNITY): Payer: Self-pay | Admitting: Nurse Practitioner

## 2016-08-09 DIAGNOSIS — Z881 Allergy status to other antibiotic agents status: Secondary | ICD-10-CM

## 2016-08-09 DIAGNOSIS — I82409 Acute embolism and thrombosis of unspecified deep veins of unspecified lower extremity: Secondary | ICD-10-CM | POA: Diagnosis present

## 2016-08-09 DIAGNOSIS — R0602 Shortness of breath: Secondary | ICD-10-CM

## 2016-08-09 DIAGNOSIS — I82442 Acute embolism and thrombosis of left tibial vein: Secondary | ICD-10-CM | POA: Diagnosis present

## 2016-08-09 DIAGNOSIS — I2699 Other pulmonary embolism without acute cor pulmonale: Principal | ICD-10-CM | POA: Diagnosis present

## 2016-08-09 DIAGNOSIS — Z8249 Family history of ischemic heart disease and other diseases of the circulatory system: Secondary | ICD-10-CM

## 2016-08-09 DIAGNOSIS — Z880 Allergy status to penicillin: Secondary | ICD-10-CM

## 2016-08-09 DIAGNOSIS — F17210 Nicotine dependence, cigarettes, uncomplicated: Secondary | ICD-10-CM | POA: Diagnosis present

## 2016-08-09 DIAGNOSIS — Z833 Family history of diabetes mellitus: Secondary | ICD-10-CM

## 2016-08-09 LAB — CBC
HEMATOCRIT: 41.1 % (ref 39.0–52.0)
HEMOGLOBIN: 14.5 g/dL (ref 13.0–17.0)
MCH: 31.1 pg (ref 26.0–34.0)
MCHC: 35.3 g/dL (ref 30.0–36.0)
MCV: 88.2 fL (ref 78.0–100.0)
Platelets: 225 10*3/uL (ref 150–400)
RBC: 4.66 MIL/uL (ref 4.22–5.81)
RDW: 12.8 % (ref 11.5–15.5)
WBC: 10.4 10*3/uL (ref 4.0–10.5)

## 2016-08-09 LAB — BASIC METABOLIC PANEL
Anion gap: 9 (ref 5–15)
BUN: 8 mg/dL (ref 6–20)
CHLORIDE: 105 mmol/L (ref 101–111)
CO2: 25 mmol/L (ref 22–32)
Calcium: 9.2 mg/dL (ref 8.9–10.3)
Creatinine, Ser: 1.22 mg/dL (ref 0.61–1.24)
GFR calc Af Amer: >60 mL/min (ref >60)
GFR calc non Af Amer: >60 mL/min (ref >60)
Glucose, Bld: 97 mg/dL (ref 65–99)
POTASSIUM: 4.3 mmol/L (ref 3.5–5.1)
Sodium: 139 mmol/L (ref 135–145)

## 2016-08-09 LAB — I-STAT TROPONIN, ED: Troponin i, poc: 0.03 ng/mL (ref 0.00–0.08)

## 2016-08-09 MED ORDER — ACETAMINOPHEN 650 MG RE SUPP: 650.0000 mg | Freq: Four times a day (QID) | RECTAL | Status: DC | PRN

## 2016-08-09 MED ORDER — ONDANSETRON HCL 4 MG/2ML IJ SOLN: 4.0000 mg | Freq: Four times a day (QID) | INTRAMUSCULAR | Status: DC | PRN

## 2016-08-09 MED ORDER — HYDROMORPHONE HCL 1 MG/ML IJ SOLN
0.5000 mg | INTRAMUSCULAR | Status: DC | PRN
  Administered 2016-08-09: 0.5 mg via INTRAVENOUS

## 2016-08-09 MED ORDER — IOPAMIDOL (ISOVUE-370) INJECTION 76%
INTRAVENOUS | Status: AC
  Administered 2016-08-09: 100 mL
  Filled 2016-08-09: qty 100

## 2016-08-09 MED ORDER — HYDROMORPHONE HCL 1 MG/ML IJ SOLN
0.5000 mg | INTRAMUSCULAR | Status: DC | PRN
  Filled 2016-08-09: qty 1

## 2016-08-09 MED ORDER — HEPARIN BOLUS VIA INFUSION
5200.0000 [IU] | Freq: Once | INTRAVENOUS | Status: AC
  Administered 2016-08-09: 5200 [IU] via INTRAVENOUS
  Filled 2016-08-09: qty 5200

## 2016-08-09 MED ORDER — ACETAMINOPHEN 325 MG PO TABS: 650.0000 mg | ORAL_TABLET | Freq: Four times a day (QID) | ORAL | Status: DC | PRN

## 2016-08-09 MED ORDER — HEPARIN (PORCINE) IN NACL 100-0.45 UNIT/ML-% IJ SOLN
1500.0000 [IU]/h | INTRAMUSCULAR | Status: DC
  Administered 2016-08-09: 1500 [IU]/h via INTRAVENOUS
  Filled 2016-08-09: qty 250

## 2016-08-09 MED ORDER — ACETAMINOPHEN 500 MG PO TABS
ORAL_TABLET | ORAL | Status: AC
  Filled 2016-08-09: qty 2

## 2016-08-09 MED ORDER — ACETAMINOPHEN 325 MG PO TABS
650.0000 mg | ORAL_TABLET | Freq: Once | ORAL | Status: AC
  Administered 2016-08-09: 650 mg via ORAL

## 2016-08-09 MED ORDER — ONDANSETRON HCL 4 MG PO TABS: 4.0000 mg | ORAL_TABLET | Freq: Four times a day (QID) | ORAL | Status: DC | PRN

## 2016-08-09 MED ORDER — ACETAMINOPHEN 325 MG PO TABS
ORAL_TABLET | ORAL | Status: AC
Start: 2016-08-09 — End: 2016-08-10
  Filled 2016-08-09: qty 2

## 2016-08-09 MED ORDER — ONDANSETRON HCL 4 MG/2ML IJ SOLN
4.0000 mg | Freq: Once | INTRAMUSCULAR | Status: AC
  Administered 2016-08-09: 4 mg via INTRAVENOUS
  Filled 2016-08-09: qty 2

## 2016-08-09 MED ORDER — HYDROMORPHONE HCL 1 MG/ML IJ SOLN
1.0000 mg | Freq: Once | INTRAMUSCULAR | Status: AC
  Administered 2016-08-09: 1 mg via INTRAVENOUS
  Filled 2016-08-09: qty 1

## 2016-08-09 MED ORDER — SODIUM CHLORIDE 0.9 % IV SOLN
INTRAVENOUS | Status: DC
  Administered 2016-08-09 – 2016-08-10 (×2): via INTRAVENOUS

## 2016-08-09 MED ORDER — HEPARIN SODIUM (PORCINE) 5000 UNIT/ML IJ SOLN: 60.0000 [IU]/kg | Freq: Once | INTRAMUSCULAR | Status: DC

## 2016-08-09 MED ORDER — MORPHINE SULFATE (PF) 4 MG/ML IV SOLN
4.0000 mg | Freq: Once | INTRAVENOUS | Status: AC
  Administered 2016-08-09: 4 mg via INTRAVENOUS
  Filled 2016-08-09: qty 1

## 2016-08-09 NOTE — H&P (Signed)
History and Physical    Jeremiah KnifeRahki R Geremia ZOX:[id number] DOB: July 05, 1975 DOA: 08/09/2016  PCP: Patient, No Pcp Per  Patient coming from: Home.  Chief Complaint: Chest pain.  HPI: Jeremiah [name] is a 41 y.o. male with history of tobacco abuse presents to the ER because of left-sided pleuritic-type of chest pain since afternoon. Patient states he has been having left leg swelling for last 3 weeks. Patient is a Naval architecttruck driver. Has been having mild shortness of breath denies any productive cough fever or chills.   ED Course: In the ER CT angiogram of the chest shows bilateral pulmonary embolism with moderate clot burden with possible pulmonary infarct. Dopplers of the lower extremity has been ordered and is yet to be done. Patient is hemodynamically stable and started on IV heparin. A significant left-sided pleuritic chest pain.  Review of Systems: As per HPI, rest all negative.   Past Medical History:  Diagnosis Date  . Anemia   . Asthma    as a child     History reviewed. No pertinent surgical history.   reports that he has been smoking Cigarettes.  He has been smoking about 0.50 packs per day. He has never used smokeless tobacco. He reports that he drinks about 9.0 oz of alcohol per week . He reports that he does not use drugs.  Allergies  Allergen Reactions  . Amoxicillin Anaphylaxis  . Penicillins Anaphylaxis    Family History  Problem Relation Age of Onset  . Diabetes Father   . Hypertension Father   . Diabetes Brother   . Hypertension Brother   . Diabetes Paternal Aunt   . Hypertension Paternal Aunt   . Diabetes Paternal Grandmother     Prior to Admission medications   Medication Sig Start Date End Date Taking? Authorizing Provider  acetaminophen (TYLENOL) 500 MG tablet Take 1,000 mg by mouth every 6 (six) hours as needed for mild pain.   Yes [provider]  oxyCODONE-acetaminophen (PERCOCET) 5-325 MG tablet Take 1 tablet by mouth every 4 (four) hours as needed  for moderate pain. 05/07/15  Yes Dione BoozeGlick, David, MD    Physical Exam: Vitals:   08/09/16 2011 08/09/16 2115 08/09/16 2231 08/09/16 2242  BP: 129/68 139/80  (!) 144/79  Pulse: 74 82  86  Resp: 19 (!) 22  18  Temp:    98.5 F (36.9 C)  TempSrc:    Oral  SpO2: 100% 100%  99%  Weight: 86.2 kg (190 lb)  82.6 kg (182 lb 1.6 oz)   Height: 5\' 7"  (1.702 m)  5\' 7"  (1.702 m)       Constitutional: Moderately built and nourished. Vitals:   08/09/16 2011 08/09/16 2115 08/09/16 2231 08/09/16 2242  BP: 129/68 139/80  (!) 144/79  Pulse: 74 82  86  Resp: 19 (!) 22  18  Temp:    98.5 F (36.9 C)  TempSrc:    Oral  SpO2: 100% 100%  99%  Weight: 86.2 kg (190 lb)  82.6 kg (182 lb 1.6 oz)   Height: 5\' 7"  (1.702 m)  5\' 7"  (1.702 m)    Eyes: Anicteric no pallor. ENMT: No discharge from the ears eyes nose and mouth. Neck: No mass felt. No JVD appreciated. Respiratory: No rhonchi or crepitations. Cardiovascular: S1 and S2 heard no murmurs appreciated. Abdomen: Soft nontender bowel sounds present. Musculoskeletal: Left leg swelling. Skin: No rash. Neurologic: Alert awake oriented to time place and person. Moves all extremities. Psychiatric: Appears normal. Normal  affect.   Labs on Admission: I have personally reviewed following labs and imaging studies  CBC:  Recent Labs Lab 08/09/16 1554  WBC 10.4  HGB 14.5  HCT 41.1  MCV 88.2  PLT 225   Basic Metabolic Panel:  Recent Labs Lab 08/09/16 1554  NA 139  K 4.3  CL 105  CO2 25  GLUCOSE 97  BUN 8  CREATININE 1.22  CALCIUM 9.2   GFR: Estimated Creatinine Clearance: 81.1 mL/min (by C-G formula based on SCr of 1.22 mg/dL). Liver Function Tests: No results for input(s): AST, ALT, ALKPHOS, BILITOT, PROT, ALBUMIN in the last 168 hours. No results for input(s): LIPASE, AMYLASE in the last 168 hours. No results for input(s): AMMONIA in the last 168 hours. Coagulation Profile: No results for input(s): INR, PROTIME in the last 168  hours. Cardiac Enzymes: No results for input(s): CKTOTAL, CKMB, CKMBINDEX, TROPONINI in the last 168 hours. BNP (last 3 results) No results for input(s): PROBNP in the last 8760 hours. HbA1C: No results for input(s): HGBA1C in the last 72 hours. CBG: No results for input(s): GLUCAP in the last 168 hours. Lipid Profile: No results for input(s): CHOL, HDL, LDLCALC, TRIG, CHOLHDL, LDLDIRECT in the last 72 hours. Thyroid Function Tests: No results for input(s): TSH, T4TOTAL, FREET4, T3FREE, THYROIDAB in the last 72 hours. Anemia Panel: No results for input(s): VITAMINB12, FOLATE, FERRITIN, TIBC, IRON, RETICCTPCT in the last 72 hours. Urine analysis:    Component Value Date/Time   COLORURINE YELLOW 01/02/2015 0932   APPEARANCEUR CLEAR 01/02/2015 0932   LABSPEC 1.020 02/05/2015 1136   PHURINE 6.5 02/05/2015 1136   GLUCOSEU NEGATIVE 02/05/2015 1136   HGBUR NEGATIVE 02/05/2015 1136   BILIRUBINUR NEGATIVE 02/05/2015 1136   KETONESUR NEGATIVE 02/05/2015 1136   PROTEINUR NEGATIVE 02/05/2015 1136   UROBILINOGEN 1.0 02/05/2015 1136   NITRITE NEGATIVE 02/05/2015 1136   LEUKOCYTESUR NEGATIVE 02/05/2015 1136   Sepsis Labs: @LABRCNTIP (procalcitonin:4,lacticidven:4) )No results found for this or any previous visit (from the past 240 hour(s)).   Radiological Exams on Admission: Dg Chest 2 View  Result Date: 08/09/2016 CLINICAL DATA:  Fever with shortness of breath EXAM: CHEST  2 VIEW COMPARISON:  05/05/2016 FINDINGS: The heart size and mediastinal contours are within normal limits. Both lungs are clear. The visualized skeletal structures are unremarkable. IMPRESSION: No active cardiopulmonary disease. Electronically Signed   By: Jasmine Pang M.D.   On: 08/09/2016 16:15   Ct Angio Chest Pe W Or Wo Contrast  Result Date: 08/09/2016 CLINICAL DATA:  LEFT-sided chest pain.  Concern pulmonary embolism. EXAM: CT ANGIOGRAPHY CHEST WITH CONTRAST TECHNIQUE: Multidetector CT imaging of the chest was  performed using the standard protocol during bolus administration of intravenous contrast. Multiplanar CT image reconstructions and MIPs were obtained to evaluate the vascular anatomy. CONTRAST:  100 cc Isovue COMPARISON:  Chest radiograph 08/09/2016 FINDINGS: Cardiovascular: There branching tubular filling defects within the RIGHT lower lobe pulmonary arteries (image 143, series 6) consistent with acute pulmonary emboli. Similar filling defect in the medial LEFT lower lobe pulmonary artery. Small filling defect within the RIGHT middle lobe pulmonary artery. There is no evidence of RIGHT ventricular strain with the RIGHT ventricle to LEFT ventricle ratio < 1. No pericardial fluid.  No acute findings aorta or great vessels. Mediastinum/Nodes: No axillary supraclavicular adenopathy. No mediastinal hilar adenopathy. Lungs/Pleura: Mild the ground-glass opacity and atelectasis in the medial LEFT lower lobe. No pneumothorax. No pulmonary edema. Upper Abdomen: Limited view of the liver, kidneys, pancreas are unremarkable. Normal adrenal  glands. Musculoskeletal: No aggressive osseous lesion. Review of the MIP images confirms the above findings. IMPRESSION: 1. Bilateral lower lobe acute pulmonary emboli. 2. Overall clot burden is moderate.  No RIGHT ventricular strain. 3. Probable small pulmonary infarction in the medial LEFT lower lobe. Critical Value/emergent results were called by telephone at the time of interpretation on 08/09/2016 at 8:05 pm to Dr. Fayrene Helper , who verbally acknowledged these results. Electronically Signed   By: Genevive Bi M.D.   On: 08/09/2016 20:08    EKG: Independently reviewed. Normal sinus rhythm.  Assessment/Plan Principal Problem:   Pulmonary embolism, bilateral (HCC) Active Problems:   Acute deep vein thrombosis (DVT) of lower extremity (HCC)   Pulmonary embolism (HCC)    1. Acute bilateral pulmonary embolism with possible lower extremity DVT - patient has been placed on IV  heparin. Check troponin and BNP and lactate levels. Check 2-D echo. If patient continues to remain stable change to oral anticoagulants. Pulmonary embolism likely unprovoked. 2. Pleuritic-type of chest pain from pulmonary infarct - pain relief medications. Check cardiac markers. 3. Tobacco abuse - tobacco cessation counseling requested.   DVT prophylaxis: Heparin. Code Status: Full code.  Family Communication: Family at the bedside.  Disposition Plan: Home.  Consults called: None.  Admission status: Observation.    Eduard Clos MD [facility] Pager [phone].  If 7PM-7AM, please contact night-coverage [email or web address] Password [email or web address]  08/09/2016, 11:15 PM

## 2016-08-09 NOTE — Progress Notes (Signed)
ANTICOAGULATION CONSULT NOTE - Initial Consult  Pharmacy Consult for heparin Indication: pulmonary embolus  Allergies  Allergen Reactions  . Amoxicillin Anaphylaxis  . Penicillins Anaphylaxis    Patient Measurements: Weight: 190 lb (86.2 kg) Heparin Dosing Weight: 83.7 kg  Vital Signs: Temp: 98.3 F (36.8 C) (05/23 1750) Temp Source: Oral (05/23 1750) BP: 129/68 (05/23 2011) Pulse Rate: 74 (05/23 2011)  Labs:  Recent Labs  08/09/16 1554  HGB 14.5  HCT 41.1  PLT 225  CREATININE 1.22    CrCl cannot be calculated (Unknown ideal weight.).   Medical History: Past Medical History:  Diagnosis Date  . Anemia   . Asthma    as a child     Assessment: 41yo M presents with chest pain and SOB. Found to have PE. Pharmacy consulted to dose heparin. No PTA anticoagulation, Hgb 14.5, Plt wnl, no bleeding noted  Goal of Therapy:  Heparin level 0.3-0.7 units/ml Monitor platelets by anticoagulation protocol: Yes   Plan:  Give 5200 units bolus x 1 Start heparin infusion at 1500 units/hr Check anti-Xa level in 6 hours and daily while on heparin Continue to monitor H&H and platelets    Mackie Paienee Ackley, PharmD PGY1 Pharmacy Resident Rx ED [phone] 08/09/2016 8:31 PM

## 2016-08-09 NOTE — ED Provider Notes (Signed)
[facility]-EMERGENCY DEPT Provider Note   CSN: [id number] Arrival date & time: 08/09/16  1545     History   Chief Complaint Chief Complaint  Patient presents with  . Chest Pain    HPI Jeremiah [name] is a 41 y.o. male.  HPI   41 year old male with history of asthma and anemia presenting complaining of chest pain. Patient is a Naval architecttruck driver, driving long distances. For the past 2-3 weeks he has had progressive worsening pain and swelling in his left leg specifically his left calf. He initially was seen at an outside hospital a week and a half ago for this complaint. He had a DVT study that was negative. He continues to endorse pain is swelling to his left leg despite taking Motrin and Tylenol. He now complaining of pleuritic chest pain and shortness of breath. Also having sharp shooting pain left side of his chest and his abdomen. He reports his aunt from the father's side has blood clot in the past. He denies any prior history of PE or DVT. No recent surgery or prolonged bed rest. He says social drinker and smoker. He did endorse subjective fever, but denies any URI symptoms, no nausea vomiting or diarrhea. Denies any injury.  Past Medical History:  Diagnosis Date  . Anemia   . Asthma    as a child     Patient Active Problem List   Diagnosis Date Noted  . Annual physical exam 02/06/2015  . Immunization due 02/06/2015    History reviewed. No pertinent surgical history.     Home Medications    Prior to Admission medications   Medication Sig Start Date End Date Taking? Authorizing Provider  celecoxib (CELEBREX) 200 MG capsule Take 1 capsule (200 mg total) by mouth 2 (two) times daily. 05/07/15   Dione BoozeGlick, David, MD  orphenadrine (NORFLEX) 100 MG tablet Take 1 tablet (100 mg total) by mouth 2 (two) times daily. 05/07/15   Dione BoozeGlick, David, MD  oxyCODONE-acetaminophen (PERCOCET) 5-325 MG tablet Take 1 tablet by mouth every 4 (four) hours as needed for moderate pain. 05/07/15   Dione BoozeGlick, David, MD      Family History Family History  Problem Relation Age of Onset  . Diabetes Father   . Hypertension Father   . Diabetes Brother   . Hypertension Brother   . Diabetes Paternal Aunt   . Hypertension Paternal Aunt   . Diabetes Paternal Grandmother     Social History Social History  Substance Use Topics  . Smoking status: Current Every Day Smoker    Packs/day: 0.50    Types: Cigarettes  . Smokeless tobacco: Never Used  . Alcohol use 9.0 oz/week    15 Cans of beer per week     Allergies   Amoxicillin and Penicillins   Review of Systems Review of Systems  All other systems reviewed and are negative.    Physical Exam Updated Vital Signs BP (!) 142/72 (BP Location: Right Arm)   Pulse 68   Temp 98.3 F (36.8 C) (Oral)   Resp 19   SpO2 96%   Physical Exam  Constitutional: He appears well-developed and well-nourished. No distress.  HENT:  Head: Atraumatic.  Eyes: Conjunctivae are normal.  Neck: Neck supple. No JVD present.  Cardiovascular: Normal rate, regular rhythm and intact distal pulses.   Pulmonary/Chest: Breath sounds normal. No respiratory distress.  Mild tachypnea without tachycardia  Abdominal: Soft. Bowel sounds are normal. He exhibits no distension. There is no tenderness.  Musculoskeletal: He exhibits edema  and tenderness (Left lower extremities: 1+ pedal edema with tenderness to left posterior calf, no palpable cord).  Neurological: He is alert.  Skin: No rash noted.  Psychiatric: He has a normal mood and affect.  Nursing note and vitals reviewed.    ED Treatments / Results  Labs (all labs ordered are listed, but only abnormal results are displayed) Labs Reviewed  BASIC METABOLIC PANEL  CBC  CBC  HEPARIN LEVEL (UNFRACTIONATED)  I-STAT TROPOININ, ED    EKG  EKG Interpretation  Date/Time:  Wednesday Aug 09 2016 15:51:17 EDT Ventricular Rate:  96 PR Interval:  144 QRS Duration: 86 QT Interval:  336 QTC Calculation: 424 R  Axis:   76 Text Interpretation:  Normal sinus rhythm Normal ECG When compared with ECG of 05/06/2015, No significant change was found Confirmed by [name] ([id number]) on 08/09/2016 6:06:46 PM       Radiology Dg Chest 2 View  Result Date: 08/09/2016 CLINICAL DATA:  Fever with shortness of breath EXAM: CHEST  2 VIEW COMPARISON:  05/05/2016 FINDINGS: The heart size and mediastinal contours are within normal limits. Both lungs are clear. The visualized skeletal structures are unremarkable. IMPRESSION: No active cardiopulmonary disease. Electronically Signed   By: Jasmine Pang M.D.   On: 08/09/2016 16:15   Ct Angio Chest Pe W Or Wo Contrast  Result Date: 08/09/2016 CLINICAL DATA:  LEFT-sided chest pain.  Concern pulmonary embolism. EXAM: CT ANGIOGRAPHY CHEST WITH CONTRAST TECHNIQUE: Multidetector CT imaging of the chest was performed using the standard protocol during bolus administration of intravenous contrast. Multiplanar CT image reconstructions and MIPs were obtained to evaluate the vascular anatomy. CONTRAST:  100 cc Isovue COMPARISON:  Chest radiograph 08/09/2016 FINDINGS: Cardiovascular: There branching tubular filling defects within the RIGHT lower lobe pulmonary arteries (image 143, series 6) consistent with acute pulmonary emboli. Similar filling defect in the medial LEFT lower lobe pulmonary artery. Small filling defect within the RIGHT middle lobe pulmonary artery. There is no evidence of RIGHT ventricular strain with the RIGHT ventricle to LEFT ventricle ratio < 1. No pericardial fluid.  No acute findings aorta or great vessels. Mediastinum/Nodes: No axillary supraclavicular adenopathy. No mediastinal hilar adenopathy. Lungs/Pleura: Mild the ground-glass opacity and atelectasis in the medial LEFT lower lobe. No pneumothorax. No pulmonary edema. Upper Abdomen: Limited view of the liver, kidneys, pancreas are unremarkable. Normal adrenal glands. Musculoskeletal: No aggressive osseous lesion.  Review of the MIP images confirms the above findings. IMPRESSION: 1. Bilateral lower lobe acute pulmonary emboli. 2. Overall clot burden is moderate.  No RIGHT ventricular strain. 3. Probable small pulmonary infarction in the medial LEFT lower lobe. Critical Value/emergent results were called by telephone at the time of interpretation on 08/09/2016 at 8:05 pm to Dr. Fayrene Helper , who verbally acknowledged these results. Electronically Signed   By: Genevive Bi M.D.   On: 08/09/2016 20:08    Procedures Procedures (including critical care time)  Medications Ordered in ED Medications  acetaminophen (TYLENOL) 325 MG tablet (not administered)  heparin ADULT infusion 100 units/mL (25000 units/263mL sodium chloride 0.45%) (1,500 Units/hr Intravenous New Bag/Given 08/09/16 2033)  acetaminophen (TYLENOL) tablet 650 mg (650 mg Oral Given 08/09/16 1604)  iopamidol (ISOVUE-370) 76 % injection (100 mLs  Contrast Given 08/09/16 1942)  morphine 4 MG/ML injection 4 mg (4 mg Intravenous Given 08/09/16 2007)  ondansetron (ZOFRAN) injection 4 mg (4 mg Intravenous Given 08/09/16 2005)  heparin bolus via infusion 5,200 Units (5,200 Units Intravenous Bolus from Bag 08/09/16 2034)  HYDROmorphone (DILAUDID)  injection 1 mg (1 mg Intravenous Given 08/09/16 2047)     Initial Impression / Assessment and Plan / ED Course  I have reviewed the triage vital signs and the nursing notes.  Pertinent labs & imaging results that were available during my care of the patient were reviewed by me and considered in my medical decision making (see chart for details).     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 He is here with left lower extremity pain and swelling concerning for DVT. He also endorsed pleuritic chest pain and shortness of breath concerning for PE. Workup initiated, will obtain chest CT angiogram and DVT study. Symptoms not suggestive of ACS. He does have a low-grade fever initially but does not have any URI symptoms. No hemoptysis. Initial chest x-ray unremarkable. Blood work is reassuring.  8:12 PM Chest CT angiogram demonstrate bilateral lower lobe acute pulmonary emboli. Overall clot burden is moderate but no right ventricular strain.    Pt is hemodynamically stable, no EKG changes.  Vascular study not done due to no available vascular tech at this time.  Pt placed on heparin per pharmacy to dose.  Will consult medicine for admission.  Care discussed with DR. Yelverton.    9:25 PM Appreciate consultation from [facility] Dr. Toniann Fail who agrees to see pt in the ER and will admit for further care.    CRITICAL CARE Performed by: Fayrene Helper Total critical care time: 35 minutes Critical care time was exclusive of separately billable procedures and treating other patients. Critical care was necessary to treat or prevent imminent or life-threatening deterioration. Critical care was time spent personally by me on the following activities: development of treatment plan with patient and/or surrogate as well as nursing, discussions with consultants, evaluation of patient's response to treatment, examination of patient, obtaining history from patient or surrogate, ordering and performing treatments and interventions, ordering and review of laboratory studies, ordering and review of radiographic studies, pulse oximetry and re-evaluation of patient's condition.    Fayrene Helper, PA-C 08/09/16 2126    Loren Racer, MD 08/10/16 [phone]

## 2016-08-09 NOTE — ED Triage Notes (Addendum)
Pt presents with c/o back, side, and chest pain. The pain began earlier this week. Initially the pain began as a shooting pain radiating up his entire left leg from foot to hip. Then the pain began to radiate into his left side, mid back, L shoulder, and chest. He reports fevers, SOB, nausea. He denies cough, strain or injury. The pain is worse with movement and palpation. He was seen at another emergency department and had an ultrasound of his left leg to evaluate for DVT with no abnormal findings at the onset of pain, but he was not evaluated for chest pain at this time.

## 2016-08-09 NOTE — ED Notes (Signed)
Pt c/o pain and no being able to breathe; pt noted to be writhing on stretcher, c/o increase in pain that brought him in. Sts it happened when he had to put his arms over his head in CT.  Pt given pain meds, nausea meds, placed on 2L O2 nasal cannula for comfort.

## 2016-08-09 NOTE — ED Notes (Signed)
Patient transported to CT 

## 2016-08-10 ENCOUNTER — Observation Stay (HOSPITAL_COMMUNITY): Payer: Self-pay

## 2016-08-10 ENCOUNTER — Observation Stay (HOSPITAL_BASED_OUTPATIENT_CLINIC_OR_DEPARTMENT_OTHER): Payer: Self-pay

## 2016-08-10 ENCOUNTER — Other Ambulatory Visit: Payer: Self-pay

## 2016-08-10 DIAGNOSIS — I2699 Other pulmonary embolism without acute cor pulmonale: Secondary | ICD-10-CM

## 2016-08-10 LAB — CBC
HEMATOCRIT: 41.2 % (ref 39.0–52.0)
Hemoglobin: 14.4 g/dL (ref 13.0–17.0)
MCH: 31.2 pg (ref 26.0–34.0)
MCHC: 35 g/dL (ref 30.0–36.0)
MCV: 89.2 fL (ref 78.0–100.0)
Platelets: 238 10*3/uL (ref 150–400)
RBC: 4.62 MIL/uL (ref 4.22–5.81)
RDW: 12.9 % (ref 11.5–15.5)
WBC: 13.4 10*3/uL — ABNORMAL HIGH (ref 4.0–10.5)

## 2016-08-10 LAB — ECHOCARDIOGRAM COMPLETE
Ao-asc: 30 cm
Area-P 1/2: 1.12 cm2
E decel time: 342 msec
E/e' ratio: 5.91
FS: 30 % (ref 28–44)
Height: 67 in
IV/PV OW: 1.18
LA ID, A-P, ES: 27 mm
LA diam index: 1.39 cm/m2
LA vol index: 8.9 mL/m2
LA vol: 17.3 mL
LAVOLA4C: 19.3 mL
LEFT ATRIUM END SYS DIAM: 27 mm
LV E/e'average: 5.91
LV PW d: 10.7 mm — AB (ref 0.6–1.1)
LV TDI E'LATERAL: 10.9
LVEEMED: 5.91
LVELAT: 10.9 cm/s
LVOT VTI: 15.4 cm
LVOT area: 2.84 cm2
LVOT peak grad rest: 3 mmHg
LVOTD: 19 mm
LVOTPV: 84.6 cm/s
LVOTSV: 44 mL
Lateral S' vel: 14.5 cm/s
MV Dec: 342
MV pk A vel: 41.5 m/s
MV pk E vel: 64.4 m/s
P 1/2 time: 197 ms
TAPSE: 21.3 mm
TDI e' medial: 9.03
Weight: 2913.6 oz

## 2016-08-10 LAB — TROPONIN I
Troponin I: <0.03 ng/mL (ref <0.03)
Troponin I: <0.03 ng/mL (ref <0.03)

## 2016-08-10 LAB — BASIC METABOLIC PANEL
ANION GAP: 9 (ref 5–15)
BUN: 9 mg/dL (ref 6–20)
CO2: 27 mmol/L (ref 22–32)
Calcium: 8.9 mg/dL (ref 8.9–10.3)
Chloride: 100 mmol/L — ABNORMAL LOW (ref 101–111)
Creatinine, Ser: 1.22 mg/dL (ref 0.61–1.24)
GFR calc Af Amer: >60 mL/min (ref >60)
GFR calc non Af Amer: >60 mL/min (ref >60)
GLUCOSE: 105 mg/dL — AB (ref 65–99)
POTASSIUM: 4 mmol/L (ref 3.5–5.1)
Sodium: 136 mmol/L (ref 135–145)

## 2016-08-10 LAB — HEPARIN LEVEL (UNFRACTIONATED)
HEPARIN UNFRACTIONATED: 1.24 [IU]/mL — AB (ref 0.30–0.70)
HEPARIN UNFRACTIONATED: 0.71 [IU]/mL — AB (ref 0.30–0.70)
Heparin Unfractionated: 0.68 IU/mL (ref 0.30–0.70)
Heparin Unfractionated: 0.59 IU/mL (ref 0.30–0.70)

## 2016-08-10 LAB — LACTIC ACID, PLASMA: LACTIC ACID, VENOUS: 1 mmol/L (ref 0.5–1.9)

## 2016-08-10 LAB — HIV ANTIBODY (ROUTINE TESTING W REFLEX): HIV SCREEN 4TH GENERATION: NONREACTIVE

## 2016-08-10 LAB — BRAIN NATRIURETIC PEPTIDE: B NATRIURETIC PEPTIDE 5: 5.9 pg/mL (ref 0.0–100.0)

## 2016-08-10 MED ORDER — HEPARIN (PORCINE) IN NACL 100-0.45 UNIT/ML-% IJ SOLN
1300.0000 [IU]/h | INTRAMUSCULAR | Status: AC
  Administered 2016-08-10 – 2016-08-12 (×3): 1250 [IU]/h via INTRAVENOUS
  Filled 2016-08-10 (×3): qty 250

## 2016-08-10 MED ORDER — HYDROMORPHONE HCL 1 MG/ML IJ SOLN
0.5000 mg | INTRAMUSCULAR | Status: DC | PRN
  Administered 2016-08-10 (×6): 0.5 mg via INTRAVENOUS
  Filled 2016-08-10 (×7): qty 1

## 2016-08-10 MED ORDER — SENNA 8.6 MG PO TABS
1.0000 | ORAL_TABLET | Freq: Every day | ORAL | Status: DC
  Administered 2016-08-10 – 2016-08-12 (×3): 8.6 mg via ORAL
  Filled 2016-08-10 (×3): qty 1

## 2016-08-10 MED ORDER — OXYCODONE HCL ER 15 MG PO T12A
15.0000 mg | EXTENDED_RELEASE_TABLET | Freq: Two times a day (BID) | ORAL | Status: DC
  Administered 2016-08-10 – 2016-08-12 (×5): 15 mg via ORAL
  Filled 2016-08-10 (×5): qty 1

## 2016-08-10 MED ORDER — DIPHENHYDRAMINE HCL 25 MG PO CAPS
25.0000 mg | ORAL_CAPSULE | Freq: Four times a day (QID) | ORAL | Status: DC | PRN
  Administered 2016-08-10 – 2016-08-12 (×5): 25 mg via ORAL
  Filled 2016-08-10 (×5): qty 1

## 2016-08-10 MED ORDER — SODIUM CHLORIDE 0.9 % IV SOLN
INTRAVENOUS | Status: DC | PRN
Start: 2016-08-10 — End: 2016-08-12

## 2016-08-10 MED ORDER — HYDROMORPHONE HCL 1 MG/ML IJ SOLN
1.0000 mg | INTRAMUSCULAR | Status: DC | PRN
  Administered 2016-08-10 – 2016-08-12 (×10): 1 mg via INTRAVENOUS
  Filled 2016-08-10 (×10): qty 1

## 2016-08-10 NOTE — Progress Notes (Signed)
Page to Dr Cena BentonVega.  2w-05, c/o substernal/sternal/midline abdominal pain. On Heparin gtt. EKG in process, tele WNL. Q2H Dilaudid given.

## 2016-08-10 NOTE — Progress Notes (Signed)
Per Dr Cena BentonVega, reduce patient's dilaudid, add PO pain med and senna; d/c IVF since pt is taking in PO food/drink (though in small amounts).  Pt resting in bed scrolling cell-phone prior to exact two hour increment for previous pain med order; for the third time this shift. Frequency decreased per Dr Cena BentonVega, to prevent GI issues since pt is now c/o abdominal discomfort. Pt denies nausea/vomiting.   Pt c/o streak of "tingling" in LUE from wrist to elbow. Denies discomfort or tingling at anterior aspect of arm. Pt denies pain in the arm. Pt denies pain in the neck and chest.   Pt states that the discomfort in the abdomen does radiate to substernal discomfort at times; but is otherwise at his baseline presentation for bilateral PE.   EKG performed; NSR Normal EKG.  Per Dr Cena BentonVega, if pt's condition continues to worsen/complaints become more frequent regarding abdominal pain/discomfort it is clear to add a CT abdomen/pelvis with contrast per his verbal/parameters.

## 2016-08-10 NOTE — Progress Notes (Signed)
Page to Dr Cena BentonVega  2w5: Itching with higher dose of dilaudid, please enter dose/prn order for benadryl.

## 2016-08-10 NOTE — Progress Notes (Signed)
ANTICOAGULATION CONSULT NOTE - Follow Up Consult  Pharmacy Consult for Heparin Indication: pulmonary embolus and and possible DVT  Allergies  Allergen Reactions  . Amoxicillin Anaphylaxis  . Penicillins Anaphylaxis    Patient Measurements: Height: 5\' 7"  (170.2 cm) Weight: 182 lb 1.6 oz (82.6 kg) IBW/kg (Calculated) : 66.1  Vital Signs: Temp: 98.6 F (37 C) (05/24 2101) Temp Source: Oral (05/24 2101) BP: 98/55 (05/24 2101) Pulse Rate: 81 (05/24 2101)  Labs:  Recent Labs  08/09/16 1554 08/10/16 0036 08/10/16 0150  08/10/16 1059 08/10/16 1441 08/10/16 2026  HGB 14.5  --  14.4  --   --   --   --   HCT 41.1  --  41.2  --   --   --   --   PLT 225  --  238  --   --   --   --   HEPARINUNFRC  --   --   --   < > 0.71* 0.68 0.59  CREATININE 1.22  --  1.22  --   --   --   --   TROPONINI  --  <0.03  --   --  <0.03  --   --   < > = values in this interval not displayed.  Estimated Creatinine Clearance: 81.1 mL/min (by C-G formula based on SCr of 1.22 mg/dL).  Medications: Heparin @ 1250 units/hr  Assessment: 41yom continues on heparin for bilateral PE and possible LE DVT. Heparin level remains therapeutic at 0.59. No bleeding.   Goal of Therapy:  Heparin level 0.3-0.7 units/ml Monitor platelets by anticoagulation protocol: Yes   Plan:  1) Continue heparin at 1250 units/hr 2) Follow up daily heparin level and CBC  Louie CasaJennifer Sheniah Supak, PharmD, BCPS 08/10/2016, 9:28 PM

## 2016-08-10 NOTE — Progress Notes (Signed)
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  Assessment & Plan:   Principal Problem:   Pulmonary embolism, bilateral (HCC) Active Problems:   Acute deep vein thrombosis (DVT) of lower extremity (HCC)   Pulmonary embolism (HCC)   Plan will be to continue current medical regimen. Also follow-up with pending test particularly echocardiogram.  DVT prophylaxis: Heparin Code Status: Full Family Communication: None at bedside Disposition Plan: pending improvement in condition   Consultants:   None   Procedures: Echocardiogram   Antimicrobials: none   Subjective: Pt has no new complaints. No acute issues overnight.  Objective: Vitals:   08/09/16 2115 08/09/16 2231 08/09/16 2242 08/10/16 0548  BP: 139/80  (!) 144/79 (!) 133/97  Pulse: 82  86 90  Resp: (!) 22  18 18   Temp:   98.5 F (36.9 C) 99.2 F (37.3 C)  TempSrc:   Oral Oral  SpO2: 100%  99% 98%  Weight:  82.6 kg (182 lb 1.6 oz)    Height:  5\' 7"  (1.702 m)      Intake/Output Summary (Last 24 hours) at 08/10/16 1100 Last data filed at 08/10/16 0800  Gross per 24 hour  Intake           831.25 ml  Output                0 ml  Net           831.25 ml   Filed Weights   08/09/16 2011 08/09/16 2231  Weight: 86.2 kg (190 lb) 82.6 kg (182 lb 1.6 oz)    Examination:  General exam: Appears calm and comfortable, in nad. Respiratory system: rhales, [place] in place, equal chest rise. Cardiovascular system: S1 & S2 heard, RRR. No JVD, murmurs, rubs Gastrointestinal system: Abdomen is nondistended, soft and nontender. No organomegaly or masses felt. Normal bowel sounds heard. Central nervous system: Alert and oriented. No focal neurological deficits. Extremities: Symmetric 5 x 5 power. Skin: No rashes, lesions or ulcers, on limited exam. Psychiatry: Mood & affect  appropriate.   Data Reviewed: I have personally reviewed following labs and imaging studies  CBC:  Recent Labs Lab 08/09/16 1554 08/10/16 0150  WBC 10.4 13.4*  HGB 14.5 14.4  HCT 41.1 41.2  MCV 88.2 89.2  PLT 225 238   Basic Metabolic Panel:  Recent Labs Lab 08/09/16 1554 08/10/16 0150  NA 139 136  K 4.3 4.0  CL 105 100*  CO2 25 27  GLUCOSE 97 105*  BUN 8 9  CREATININE 1.22 1.22  CALCIUM 9.2 8.9   GFR: Estimated Creatinine Clearance: 81.1 mL/min (by C-G formula based on SCr of 1.22 mg/dL). Liver Function Tests: No results for input(s): AST, ALT, ALKPHOS, BILITOT, PROT, ALBUMIN in the last 168 hours. No results for input(s): LIPASE, AMYLASE in the last 168 hours. No results for input(s): AMMONIA in the last 168 hours. Coagulation Profile: No results for input(s): INR, PROTIME in the last 168 hours. Cardiac Enzymes:  Recent Labs Lab 08/10/16 0036  TROPONINI <0.03   BNP (last 3 results) No results for input(s): PROBNP in the last 8760 hours. HbA1C: No results for input(s): HGBA1C in the last 72 hours. CBG: No results for input(s): GLUCAP in the last 168 hours. Lipid Profile: No results for input(s): CHOL, HDL, LDLCALC, TRIG, CHOLHDL, LDLDIRECT in  the last 72 hours. Thyroid Function Tests: No results for input(s): TSH, T4TOTAL, FREET4, T3FREE, THYROIDAB in the last 72 hours. Anemia Panel: No results for input(s): VITAMINB12, FOLATE, FERRITIN, TIBC, IRON, RETICCTPCT in the last 72 hours. Sepsis Labs:  Recent Labs Lab 08/10/16 0036  LATICACIDVEN 1.0    No results found for this or any previous visit (from the past 240 hour(s)).    Radiology Studies: Dg Chest 2 View  Result Date: 08/09/2016 CLINICAL DATA:  Fever with shortness of breath EXAM: CHEST  2 VIEW COMPARISON:  05/05/2016 FINDINGS: The heart size and mediastinal contours are within normal limits. Both lungs are clear. The visualized skeletal structures are unremarkable. IMPRESSION: No active  cardiopulmonary disease. Electronically Signed   By: Jasmine Pang M.D.   On: 08/09/2016 16:15   Ct Angio Chest Pe W Or Wo Contrast  Result Date: 08/09/2016 CLINICAL DATA:  LEFT-sided chest pain.  Concern pulmonary embolism. EXAM: CT ANGIOGRAPHY CHEST WITH CONTRAST TECHNIQUE: Multidetector CT imaging of the chest was performed using the standard protocol during bolus administration of intravenous contrast. Multiplanar CT image reconstructions and MIPs were obtained to evaluate the vascular anatomy. CONTRAST:  100 cc Isovue COMPARISON:  Chest radiograph 08/09/2016 FINDINGS: Cardiovascular: There branching tubular filling defects within the RIGHT lower lobe pulmonary arteries (image 143, series 6) consistent with acute pulmonary emboli. Similar filling defect in the medial LEFT lower lobe pulmonary artery. Small filling defect within the RIGHT middle lobe pulmonary artery. There is no evidence of RIGHT ventricular strain with the RIGHT ventricle to LEFT ventricle ratio < 1. No pericardial fluid.  No acute findings aorta or great vessels. Mediastinum/Nodes: No axillary supraclavicular adenopathy. No mediastinal hilar adenopathy. Lungs/Pleura: Mild the ground-glass opacity and atelectasis in the medial LEFT lower lobe. No pneumothorax. No pulmonary edema. Upper Abdomen: Limited view of the liver, kidneys, pancreas are unremarkable. Normal adrenal glands. Musculoskeletal: No aggressive osseous lesion. Review of the MIP images confirms the above findings. IMPRESSION: 1. Bilateral lower lobe acute pulmonary emboli. 2. Overall clot burden is moderate.  No RIGHT ventricular strain. 3. Probable small pulmonary infarction in the medial LEFT lower lobe. Critical Value/emergent results were called by telephone at the time of interpretation on 08/09/2016 at 8:05 pm to Dr. Fayrene Helper , who verbally acknowledged these results. Electronically Signed   By: Genevive Bi M.D.   On: 08/09/2016 20:08    Scheduled  Meds: Continuous Infusions: . sodium chloride 75 mL/hr at 08/10/16 0633  . heparin 1,250 Units/hr (08/10/16 0654)     LOS: 0 days    Time spent: > 15 minutes   Penny Pia, MD [facility] Pager [phone]  If 7PM-7AM, please contact night-coverage [email or web address] Password [email or web address] 08/10/2016, 11:00 AM

## 2016-08-10 NOTE — Progress Notes (Signed)
  Echocardiogram 2D Echocardiogram has been performed.  Janalyn HarderWest, Kurk Corniel R 08/10/2016, 12:01 PM

## 2016-08-10 NOTE — Progress Notes (Signed)
ANTICOAGULATION CONSULT NOTE - Follow Up Consult  Pharmacy Consult for Heparin Indication: pulmonary embolus and and possible DVT  Allergies  Allergen Reactions  . Amoxicillin Anaphylaxis  . Penicillins Anaphylaxis    Patient Measurements: Height: 5\' 7"  (170.2 cm) Weight: 182 lb 1.6 oz (82.6 kg) IBW/kg (Calculated) : 66.1  Vital Signs: Temp: 98 F (36.7 C) (05/24 1408) Temp Source: Oral (05/24 1408) BP: 135/78 (05/24 1408) Pulse Rate: 75 (05/24 1408)  Labs:  Recent Labs  08/09/16 1554 08/10/16 0036 08/10/16 0150 08/10/16 0427 08/10/16 1059 08/10/16 1441  HGB 14.5  --  14.4  --   --   --   HCT 41.1  --  41.2  --   --   --   PLT 225  --  238  --   --   --   HEPARINUNFRC  --   --   --  1.24* 0.71* 0.68  CREATININE 1.22  --  1.22  --   --   --   TROPONINI  --  <0.03  --   --  <0.03  --     Estimated Creatinine Clearance: 81.1 mL/min (by C-G formula based on SCr of 1.22 mg/dL).   Medications:  Scheduled:  . oxyCODONE  15 mg Oral Q12H  . senna  1 tablet Oral Daily    Assessment: 41yo with PE with heparin held and adjusted over night.  F/U Heparin level was drawn early and therefore repeated.  Now within goal at current rate with Hg and pltc wnl.  Goal of Therapy:  Heparin level 0.3-0.7 units/ml Monitor platelets by anticoagulation protocol: Yes   Plan:  Continue heparin 1250 units/hr Repeat heparin level 6hr to verify therapeutic x 2 Watch for s/s of bleeding   Marisue HumbleKendra Detrell Umscheid, B.S., PharmD Clinical Pharmacist [facility]

## 2016-08-10 NOTE — Progress Notes (Signed)
ANTICOAGULATION CONSULT NOTE   Pharmacy Consult for heparin Indication: pulmonary embolus  Allergies  Allergen Reactions  . Amoxicillin Anaphylaxis  . Penicillins Anaphylaxis    Patient Measurements: Height: 5\' 7"  (170.2 cm) Weight: 182 lb 1.6 oz (82.6 kg) IBW/kg (Calculated) : 66.1 Heparin Dosing Weight: 83.7 kg  Vital Signs: Temp: 99.2 F (37.3 C) (05/24 0548) Temp Source: Oral (05/24 0548) BP: 133/97 (05/24 0548) Pulse Rate: 90 (05/24 0548)  Labs:  Recent Labs  08/09/16 1554 08/10/16 0036 08/10/16 0150 08/10/16 0427  HGB 14.5  --  14.4  --   HCT 41.1  --  41.2  --   PLT 225  --  238  --   HEPARINUNFRC  --   --   --  1.24*  CREATININE 1.22  --  1.22  --   TROPONINI  --  <0.03  --   --     Estimated Creatinine Clearance: 81.1 mL/min (by C-G formula based on SCr of 1.22 mg/dL).   Medical History: Past Medical History:  Diagnosis Date  . Anemia   . Asthma    as a child     Assessment: 41yo M started on heparin infusion for acute PE. Initial heparin level is SUPRAtherapeutic at 1.24. No reported bleeding. CBC stable.   Goal of Therapy:  Heparin level 0.3-0.7 units/ml Monitor platelets by anticoagulation protocol: Yes   Plan:  1. Hold heparin infusion for 1 hour and then resume heparin infusion at 1250 units/hr 2. Heparin level in 6 hours    Pollyann SamplesAndy Ahmon Tosi, PharmD, BCPS 08/10/2016, 5:59 AM

## 2016-08-11 ENCOUNTER — Inpatient Hospital Stay (HOSPITAL_COMMUNITY): Payer: Self-pay

## 2016-08-11 DIAGNOSIS — I824Z9 Acute embolism and thrombosis of unspecified deep veins of unspecified distal lower extremity: Secondary | ICD-10-CM

## 2016-08-11 DIAGNOSIS — R609 Edema, unspecified: Secondary | ICD-10-CM

## 2016-08-11 LAB — CBC
HCT: 37.6 % — ABNORMAL LOW (ref 39.0–52.0)
Hemoglobin: 13.1 g/dL (ref 13.0–17.0)
MCH: 31 pg (ref 26.0–34.0)
MCHC: 34.8 g/dL (ref 30.0–36.0)
MCV: 89.1 fL (ref 78.0–100.0)
PLATELETS: 208 10*3/uL (ref 150–400)
RBC: 4.22 MIL/uL (ref 4.22–5.81)
RDW: 12.7 % (ref 11.5–15.5)
WBC: 13.3 10*3/uL — ABNORMAL HIGH (ref 4.0–10.5)

## 2016-08-11 LAB — HEPARIN LEVEL (UNFRACTIONATED): HEPARIN UNFRACTIONATED: 0.56 [IU]/mL (ref 0.30–0.70)

## 2016-08-11 MED ORDER — RIVAROXABAN 20 MG PO TABS: 20.0000 mg | ORAL_TABLET | Freq: Every day | ORAL | Status: DC

## 2016-08-11 MED ORDER — RIVAROXABAN 15 MG PO TABS
15.0000 mg | ORAL_TABLET | Freq: Two times a day (BID) | ORAL | Status: DC
  Administered 2016-08-12: 15 mg via ORAL
  Filled 2016-08-11: qty 1

## 2016-08-11 NOTE — Progress Notes (Signed)
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  Assessment & Plan:   Principal Problem:   Pulmonary embolism, bilateral (HCC) - continue heparin for one more day then transition to oral anticoagulant. Discussed with pharmacist -  Echocardiogram completed: EF of 55-60 with normal wall motion   Active Problems:   Acute deep vein thrombosis (DVT) of lower extremity (HCC) - We'll continue game plan as listed above. Refer patient to vascular surgeon as outpatient.  Abdominal discomfort - Pt tolerating diet - Ct angiogram of chest did report normal findings of upper abdominal area.   DVT prophylaxis: Heparin Code Status: Full Family Communication: None at bedside Disposition Plan: pending improvement in condition   Consultants:   None   Procedures: Echocardiogram   Antimicrobials: none   Subjective: Pt has no new complaints. No acute issues overnight.  Objective: Vitals:   08/10/16 1408 08/10/16 2213 08/11/16 0634 08/11/16 1400  BP: 135/78 135/82 121/76 140/75  Pulse: 75 90 (!) 101 (!) 110  Resp: 18 18 18 18   Temp: 98 F (36.7 C) 99.7 F (37.6 C) 99 F (37.2 C) 97.8 F (36.6 C)  TempSrc: Oral Oral Oral Oral  SpO2: 98% 98% 92% 95%  Weight:      Height:        Intake/Output Summary (Last 24 hours) at 08/11/16 1535 Last data filed at 08/11/16 1100  Gross per 24 hour  Intake              370 ml  Output             1100 ml  Net             -730 ml   Filed Weights   08/09/16 2011 08/09/16 2231  Weight: 86.2 kg (190 lb) 82.6 kg (182 lb 1.6 oz)    Examination:  General exam: Appears calm and comfortable, in nad. Respiratory system: rhales, [place] in place, equal chest rise. Cardiovascular system: S1 & S2 heard, RRR. No JVD, murmurs, rubs Gastrointestinal system: Abdomen is nondistended, soft and nontender. No  organomegaly or masses felt. Normal bowel sounds heard. Central nervous system: Alert and oriented. No focal neurological deficits. Extremities: Symmetric 5 x 5 power. Skin: No rashes, lesions or ulcers, on limited exam. Psychiatry: Mood & affect appropriate.   Data Reviewed: I have personally reviewed following labs and imaging studies  CBC:  Recent Labs Lab 08/09/16 1554 08/10/16 0150 08/11/16 0252  WBC 10.4 13.4* 13.3*  HGB 14.5 14.4 13.1  HCT 41.1 41.2 37.6*  MCV 88.2 89.2 89.1  PLT 225 238 208   Basic Metabolic Panel:  Recent Labs Lab 08/09/16 1554 08/10/16 0150  NA 139 136  K 4.3 4.0  CL 105 100*  CO2 25 27  GLUCOSE 97 105*  BUN 8 9  CREATININE 1.22 1.22  CALCIUM 9.2 8.9   GFR: Estimated Creatinine Clearance: 81.1 mL/min (by C-G formula based on SCr of 1.22 mg/dL). Liver Function Tests: No results for input(s): AST, ALT, ALKPHOS, BILITOT, PROT, ALBUMIN in the last 168 hours. No results for input(s): LIPASE, AMYLASE in the last 168 hours. No results for input(s): AMMONIA in the last 168 hours. Coagulation Profile: No results for input(s): INR, PROTIME in the last 168 hours. Cardiac Enzymes:  Recent Labs Lab 08/10/16 0036 08/10/16 1059  TROPONINI <0.03 <0.03  BNP (last 3 results) No results for input(s): PROBNP in the last 8760 hours. HbA1C: No results for input(s): HGBA1C in the last 72 hours. CBG: No results for input(s): GLUCAP in the last 168 hours. Lipid Profile: No results for input(s): CHOL, HDL, LDLCALC, TRIG, CHOLHDL, LDLDIRECT in the last 72 hours. Thyroid Function Tests: No results for input(s): TSH, T4TOTAL, FREET4, T3FREE, THYROIDAB in the last 72 hours. Anemia Panel: No results for input(s): VITAMINB12, FOLATE, FERRITIN, TIBC, IRON, RETICCTPCT in the last 72 hours. Sepsis Labs:  Recent Labs Lab 08/10/16 0036  LATICACIDVEN 1.0    No results found for this or any previous visit (from the past 240 hour(s)).    Radiology  Studies: Dg Chest 2 View  Result Date: 08/09/2016 CLINICAL DATA:  Fever with shortness of breath EXAM: CHEST  2 VIEW COMPARISON:  05/05/2016 FINDINGS: The heart size and mediastinal contours are within normal limits. Both lungs are clear. The visualized skeletal structures are unremarkable. IMPRESSION: No active cardiopulmonary disease. Electronically Signed   By: Jasmine PangKim  Fujinaga M.D.   On: 08/09/2016 16:15   Ct Angio Chest Pe W Or Wo Contrast  Result Date: 08/09/2016 CLINICAL DATA:  LEFT-sided chest pain.  Concern pulmonary embolism. EXAM: CT ANGIOGRAPHY CHEST WITH CONTRAST TECHNIQUE: Multidetector CT imaging of the chest was performed using the standard protocol during bolus administration of intravenous contrast. Multiplanar CT image reconstructions and MIPs were obtained to evaluate the vascular anatomy. CONTRAST:  100 cc Isovue COMPARISON:  Chest radiograph 08/09/2016 FINDINGS: Cardiovascular: There branching tubular filling defects within the RIGHT lower lobe pulmonary arteries (image 143, series 6) consistent with acute pulmonary emboli. Similar filling defect in the medial LEFT lower lobe pulmonary artery. Small filling defect within the RIGHT middle lobe pulmonary artery. There is no evidence of RIGHT ventricular strain with the RIGHT ventricle to LEFT ventricle ratio < 1. No pericardial fluid.  No acute findings aorta or great vessels. Mediastinum/Nodes: No axillary supraclavicular adenopathy. No mediastinal hilar adenopathy. Lungs/Pleura: Mild the ground-glass opacity and atelectasis in the medial LEFT lower lobe. No pneumothorax. No pulmonary edema. Upper Abdomen: Limited view of the liver, kidneys, pancreas are unremarkable. Normal adrenal glands. Musculoskeletal: No aggressive osseous lesion. Review of the MIP images confirms the above findings. IMPRESSION: 1. Bilateral lower lobe acute pulmonary emboli. 2. Overall clot burden is moderate.  No RIGHT ventricular strain. 3. Probable small pulmonary  infarction in the medial LEFT lower lobe. Critical Value/emergent results were called by telephone at the time of interpretation on 08/09/2016 at 8:05 pm to Dr. Fayrene HelperBOWIE TRAN , who verbally acknowledged these results. Electronically Signed   By: Genevive BiStewart  Edmunds M.D.   On: 08/09/2016 20:08    Scheduled Meds: . oxyCODONE  15 mg Oral Q12H  . [START ON 08/12/2016] rivaroxaban  15 mg Oral BID   Followed by  . [START ON 09/02/2016] rivaroxaban  20 mg Oral Q supper  . senna  1 tablet Oral Daily   Continuous Infusions: . sodium chloride    . heparin 1,250 Units/hr (08/11/16 0948)     LOS: 1 day    Time spent: > 25minutes   Penny PiaVEGA, Meridee Branum, MD [facility] Pager [phone]  If 7PM-7AM, please contact night-coverage [email or web address] Password [email or web address] 08/11/2016, 3:35 PM

## 2016-08-11 NOTE — Progress Notes (Signed)
ANTICOAGULATION CONSULT NOTE  Pharmacy Consult for Heparin Indication: pulmonary embolus and and possible DVT  Allergies  Allergen Reactions  . Amoxicillin Anaphylaxis  . Penicillins Anaphylaxis    Patient Measurements: Height: 5\' 7"  (170.2 cm) Weight: 182 lb 1.6 oz (82.6 kg) IBW/kg (Calculated) : 66.1  Vital Signs: Temp: 99 F (37.2 C) (05/25 0634) Temp Source: Oral (05/25 0634) BP: 121/76 (05/25 0634) Pulse Rate: 101 (05/25 0634)  Labs:  Recent Labs  08/09/16 1554 08/10/16 0036 08/10/16 0150  08/10/16 1059 08/10/16 1441 08/10/16 2026 08/11/16 0252  HGB 14.5  --  14.4  --   --   --   --  13.1  HCT 41.1  --  41.2  --   --   --   --  37.6*  PLT 225  --  238  --   --   --   --  208  HEPARINUNFRC  --   --   --   < > 0.71* 0.68 0.59 0.56  CREATININE 1.22  --  1.22  --   --   --   --   --   TROPONINI  --  <0.03  --   --  <0.03  --   --   --   < > = values in this interval not displayed.  Estimated Creatinine Clearance: 81.1 mL/min (by C-G formula based on SCr of 1.22 mg/dL).   Medications:  Scheduled:  . oxyCODONE  15 mg Oral Q12H  . senna  1 tablet Oral Daily    Assessment: 41 yo with PE in R lower lobe. No R heart strain. Therapeutic on heparin gtt. h/h and plts wnl. Planning to transition to Xarelto tomorrow.    Goal of Therapy:  Heparin level 0.3-0.7 units/ml Monitor platelets by anticoagulation protocol: Yes    Plan:  Continue heparin at 1250 units/hr Daily HL, CBC Planing to transition to Xarelto tomorrow    Agapito GamesAlison Chen Saadeh, PharmD, BCPS Clinical Pharmacist 08/11/2016 9:18 AM

## 2016-08-11 NOTE — Progress Notes (Signed)
**  Preliminary report by tech**  Bilateral lower extremity venous duplex complete. There is evidence of acute deep vein thrombosis involving the posterior tibial veins of the left lower extremity. There is no evidence of superficial vein thrombosis involving the left lower extremity. There is no evidence of deep or superficial vein thrombosis involving the right lower extremity. There is no evidence of a Zacher's cyst bilaterally. Results given to Dr. Cena BentonVega.  08/11/16 10:28 AM Olen CordialGreg Thresea Doble RVT

## 2016-08-11 NOTE — Care Management Note (Signed)
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 Also gave instruction on where to fill starter pack if discharged over weekend- ([facility]) - CM available for any further d/c needs.   Expected Discharge Date:                  Expected Discharge Plan:  Home/Self Care  In-House Referral:     Discharge planning Services  CM Consult, Medication Assistance, Indigent Health Clinic  Post Acute Care Choice:    Choice offered to:     DME Arranged:    DME Agency:     HH Arranged:    HH Agency:     Status of Service:  In process, will continue to follow  If discussed at Long Length of Stay Meetings, dates discussed:    Discharge Disposition:   Additional Comments:  Darrold SpanWebster, Musa Rewerts Hall, RN 08/11/2016, 3:07 PM

## 2016-08-12 LAB — CBC
HCT: 36.7 % — ABNORMAL LOW (ref 39.0–52.0)
Hemoglobin: 12.5 g/dL — ABNORMAL LOW (ref 13.0–17.0)
MCH: 30.2 pg (ref 26.0–34.0)
MCHC: 34.1 g/dL (ref 30.0–36.0)
MCV: 88.6 fL (ref 78.0–100.0)
Platelets: 219 10*3/uL (ref 150–400)
RBC: 4.14 MIL/uL — ABNORMAL LOW (ref 4.22–5.81)
RDW: 12.6 % (ref 11.5–15.5)
WBC: 11.8 10*3/uL — ABNORMAL HIGH (ref 4.0–10.5)

## 2016-08-12 LAB — HEPARIN LEVEL (UNFRACTIONATED): Heparin Unfractionated: 0.23 [IU]/mL — ABNORMAL LOW (ref 0.30–0.70)

## 2016-08-12 MED ORDER — RIVAROXABAN (XARELTO) VTE STARTER PACK (15 & 20 MG): ORAL_TABLET | ORAL | 0 refills | Status: DC

## 2016-08-12 MED ORDER — SENNA 8.6 MG PO TABS: 1.0000 | ORAL_TABLET | Freq: Every day | ORAL | 0 refills | Status: DC

## 2016-08-12 MED ORDER — OXYCODONE HCL ER 15 MG PO T12A: 15.0000 mg | EXTENDED_RELEASE_TABLET | Freq: Two times a day (BID) | ORAL | 0 refills | Status: DC

## 2016-08-12 NOTE — Progress Notes (Addendum)
ANTICOAGULATION CONSULT NOTE  Pharmacy Consult for Heparin Indication: pulmonary embolus and and possible DVT  Allergies  Allergen Reactions  . Amoxicillin Anaphylaxis  . Penicillins Anaphylaxis    Patient Measurements: Height: 5\' 7"  (170.2 cm) Weight: 182 lb 1.6 oz (82.6 kg) IBW/kg (Calculated) : 66.1  Vital Signs: Temp: 99.7 F (37.6 C) (05/26 0405) Temp Source: Oral (05/26 0405) BP: 124/75 (05/26 0405) Pulse Rate: 85 (05/26 0405)  Labs:  Recent Labs  08/09/16 1554 08/10/16 0036 08/10/16 0150  08/10/16 1059  08/10/16 2026 08/11/16 0252 08/12/16 0151  HGB 14.5  --  14.4  --   --   --   --  13.1 12.5*  HCT 41.1  --  41.2  --   --   --   --  37.6* 36.7*  PLT 225  --  238  --   --   --   --  208 219  HEPARINUNFRC  --   --   --   < > 0.71*  < > 0.59 0.56 0.23*  CREATININE 1.22  --  1.22  --   --   --   --   --   --   TROPONINI  --  <0.03  --   --  <0.03  --   --   --   --   < > = values in this interval not displayed.  Estimated Creatinine Clearance: 81.1 mL/min (by C-G formula based on SCr of 1.22 mg/dL).   Medications:  Scheduled:  . oxyCODONE  15 mg Oral Q12H  . rivaroxaban  15 mg Oral BID   Followed by  . [START ON 09/02/2016] rivaroxaban  20 mg Oral Q supper  . senna  1 tablet Oral Daily    Assessment: [age] yoM with PE in R lower lobe. Pt on heparin drip with plan to transition to Xarelto at 1000 today 5/26. Heparin level currently slightly therapeutic at 0.23, no issues with line or infusion per RN. CBC stable.   Goal of Therapy:  Heparin level 0.3-0.7 units/ml Monitor platelets by anticoagulation protocol: Yes    Plan:  -Increase heparin slightly to 1300 units/hr -Discontinue heparin and transition to Xarelto at 1000 -Monitor S/Sx bleeding  Fredonia HighlandMichael Ramez Arrona, PharmD PGY-1 Pharmacy Resident Pager: [phone] 08/12/2016

## 2016-08-12 NOTE — Discharge Summary (Addendum)
Physician Discharge Summary  Jackelyn KnifeRahki R Poncedeleon WUJ:[id number] DOB: Sep 20, 1975 DOA: 08/09/2016  PCP: Patient, No Pcp Per  Admit date: 08/09/2016 Discharge date: 08/12/2016  Time spent: > 35 minutes  Recommendations for Outpatient Follow-up:  1. Please decide when to clear patient for work. He is not to operate heavy machinery while on opioids.   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  Hospital Course:  Principal Problem:   Pulmonary embolism, bilateral (HCC) - continue heparin for one more day then transition to oral anticoagulant. Discussed with pharmacist -  Echocardiogram completed: EF of 55-60 with normal wall motion  Addendum: Oxy extended release was too expensive for patient as such he requested I call in oxy IR. I have discontinued oxy ER and write script for oxy IR  Active Problems:   Acute deep vein thrombosis (DVT) of lower extremity (HCC) - We'll continue game plan as listed above. Refer patient to vascular surgeon as outpatient.  Abdominal discomfort - Pt tolerating diet - Ct angiogram of chest did report normal findings of upper abdominal area.   Procedures:  None  Consultations:  None  Discharge Exam: Vitals:   08/12/16 1001 08/12/16 1509  BP:  134/78  Pulse:  81  Resp:  18  Temp: 98.8 F (37.1 C) 99.5 F (37.5 C)    General: Pt alert and awake, in nad. Cardiovascular: rrr, no rubs Respiratory: CTA BL, no wheezes  Discharge Instructions   Discharge Instructions    Call MD for:  difficulty breathing, headache or visual disturbances     Complete by:  As directed    Call MD for:  extreme fatigue    Complete by:  As directed    Diet - low sodium heart healthy    Complete by:  As directed    Discharge instructions    Complete by:  As directed    You'll need clearance from your primary care physician prior to being able to operate heavy machinery   Driving Restrictions    Complete by:  As directed    While on opiod pain medication   Increase activity slowly    Complete by:  As directed      Current Discharge Medication List    START taking these medications   Details  oxyCODONE (OXYCONTIN) 15 mg 12 hr tablet Take 1 tablet (15 mg total) by mouth every 12 (twelve) hours. Qty: 14 tablet, Refills: 0    Rivaroxaban (XARELTO STARTER PACK) 15 & 20 MG TBPK Take as directed on package: Start with one 15mg  tablet by mouth twice a day with food. On Day 22, switch to one 20mg  tablet once a day with food. Qty: 51 each, Refills: 0    senna (SENOKOT) 8.6 MG TABS tablet Take 1 tablet (8.6 mg total) by mouth daily. Qty: 30 each, Refills: 0      STOP taking these medications     acetaminophen (TYLENOL) 500 MG tablet      oxyCODONE-acetaminophen (PERCOCET) 5-325 MG tablet        Allergies  Allergen Reactions  . Amoxicillin Anaphylaxis  .  Penicillins Anaphylaxis   Follow-up Information    [facility]. Schedule an appointment as soon as possible for a visit.   Specialty:  Internal Medicine Contact information: [address] [place] [address] [phone]           The results of significant diagnostics from this hospitalization (including imaging, microbiology, ancillary and laboratory) are listed below for reference.    Significant Diagnostic Studies: Dg Chest 2 View  Result Date: 08/09/2016 CLINICAL DATA:  Fever with shortness of breath EXAM: CHEST  2 VIEW COMPARISON:  05/05/2016 FINDINGS: The heart size and mediastinal contours are within normal limits. Both lungs are  clear. The visualized skeletal structures are unremarkable. IMPRESSION: No active cardiopulmonary disease. Electronically Signed   By: Jasmine Pang M.D.   On: 08/09/2016 16:15   Ct Angio Chest Pe W Or Wo Contrast  Result Date: 08/09/2016 CLINICAL DATA:  LEFT-sided chest pain.  Concern pulmonary embolism. EXAM: CT ANGIOGRAPHY CHEST WITH CONTRAST TECHNIQUE: Multidetector CT imaging of the chest was performed using the standard protocol during bolus administration of intravenous contrast. Multiplanar CT image reconstructions and MIPs were obtained to evaluate the vascular anatomy. CONTRAST:  100 cc Isovue COMPARISON:  Chest radiograph 08/09/2016 FINDINGS: Cardiovascular: There branching tubular filling defects within the RIGHT lower lobe pulmonary arteries (image 143, series 6) consistent with acute pulmonary emboli. Similar filling defect in the medial LEFT lower lobe pulmonary artery. Small filling defect within the RIGHT middle lobe pulmonary artery. There is no evidence of RIGHT ventricular strain with the RIGHT ventricle to LEFT ventricle ratio < 1. No pericardial fluid.  No acute findings aorta or great vessels. Mediastinum/Nodes: No axillary supraclavicular adenopathy. No mediastinal hilar adenopathy. Lungs/Pleura: Mild the ground-glass opacity and atelectasis in the medial LEFT lower lobe. No pneumothorax. No pulmonary edema. Upper Abdomen: Limited view of the liver, kidneys, pancreas are unremarkable. Normal adrenal glands. Musculoskeletal: No aggressive osseous lesion. Review of the MIP images confirms the above findings. IMPRESSION: 1. Bilateral lower lobe acute pulmonary emboli. 2. Overall clot burden is moderate.  No RIGHT ventricular strain. 3. Probable small pulmonary infarction in the medial LEFT lower lobe. Critical Value/emergent results were called by telephone at the time of interpretation on 08/09/2016 at 8:05 pm to Dr. Fayrene Helper , who verbally acknowledged these results. Electronically Signed    By: Genevive Bi M.D.   On: 08/09/2016 20:08    Microbiology: No results found for this or any previous visit (from the past 240 hour(s)).   Labs: Basic Metabolic Panel:  Recent Labs Lab 08/09/16 1554 08/10/16 0150  NA 139 136  K 4.3 4.0  CL 105 100*  CO2 25 27  GLUCOSE 97 105*  BUN 8 9  CREATININE 1.22 1.22  CALCIUM 9.2 8.9   Liver Function Tests: No results for input(s): AST, ALT, ALKPHOS, BILITOT, PROT, ALBUMIN in the last 168 hours. No results for input(s): LIPASE, AMYLASE in the last 168 hours. No results for input(s): AMMONIA in the last 168 hours. CBC:  Recent Labs Lab 08/09/16 1554 08/10/16 0150 08/11/16 0252 08/12/16 0151  WBC 10.4 13.4* 13.3* 11.8*  HGB 14.5 14.4 13.1 12.5*  HCT 41.1 41.2 37.6* 36.7*  MCV 88.2 89.2 89.1 88.6  PLT 225 238 208 219   Cardiac Enzymes:  Recent Labs Lab 08/10/16 0036 08/10/16 1059  TROPONINI <0.03 <0.03   BNP: BNP (last 3 results)  Recent Labs  08/10/16 0036  BNP 5.9    ProBNP (last 3  results) No results for input(s): PROBNP in the last 8760 hours.  CBG: No results for input(s): GLUCAP in the last 168 hours.     Signed:  Penny Pia MD.  [facility] 08/12/2016, 3:40 PM

## 2016-08-12 NOTE — Progress Notes (Signed)
Discharged to home with family office visits in place teaching done  

## 2016-08-13 MED ORDER — OXYCODONE HCL 15 MG PO TABS: 15.0000 mg | ORAL_TABLET | Freq: Three times a day (TID) | ORAL | 0 refills | Status: DC | PRN

## 2016-08-22 ENCOUNTER — Encounter: Payer: Self-pay | Admitting: Family Medicine

## 2016-08-22 ENCOUNTER — Ambulatory Visit (INDEPENDENT_AMBULATORY_CARE_PROVIDER_SITE_OTHER): Payer: Self-pay | Admitting: Family Medicine

## 2016-08-22 VITALS — BP 137/86 | HR 85 | Temp 98.5°F | Resp 16 | Ht 67.0 in | Wt 182.0 lb

## 2016-08-22 DIAGNOSIS — I2699 Other pulmonary embolism without acute cor pulmonale: Secondary | ICD-10-CM

## 2016-08-22 DIAGNOSIS — F17200 Nicotine dependence, unspecified, uncomplicated: Secondary | ICD-10-CM

## 2016-08-22 DIAGNOSIS — D72829 Elevated white blood cell count, unspecified: Secondary | ICD-10-CM

## 2016-08-22 DIAGNOSIS — Z23 Encounter for immunization: Secondary | ICD-10-CM

## 2016-08-22 LAB — CBC WITH DIFFERENTIAL/PLATELET
BASOS PCT: 0 %
Basophils Absolute: 0 cells/uL (ref 0–200)
Eosinophils Absolute: 192 cells/uL (ref 15–500)
Eosinophils Relative: 2 %
HEMATOCRIT: 41 % (ref 38.5–50.0)
HEMOGLOBIN: 13.8 g/dL (ref 13.2–17.1)
LYMPHS ABS: 2400 {cells}/uL (ref 850–3900)
Lymphocytes Relative: 25 %
MCH: 30.5 pg (ref 27.0–33.0)
MCHC: 33.7 g/dL (ref 32.0–36.0)
MCV: 90.7 fL (ref 80.0–100.0)
MONO ABS: 672 {cells}/uL (ref 200–950)
MPV: 8.6 fL (ref 7.5–12.5)
Monocytes Relative: 7 %
Neutro Abs: 6336 cells/uL (ref 1500–7800)
Neutrophils Relative %: 66 %
Platelets: 453 10*3/uL — ABNORMAL HIGH (ref 140–400)
RBC: 4.52 MIL/uL (ref 4.20–5.80)
RDW: 13.6 % (ref 11.0–15.0)
WBC: 9.6 10*3/uL (ref 3.8–10.8)

## 2016-08-22 MED ORDER — RIVAROXABAN 20 MG PO TABS: 20.0000 mg | ORAL_TABLET | Freq: Every day | ORAL | 1 refills | Status: DC

## 2016-08-22 NOTE — Patient Instructions (Addendum)
Will start Xarelto 20 mg daily on day 22 for 6 months.  Watch closely for signs of bleeding.     Pulmonary Embolism A pulmonary embolism (PE) is a sudden blockage or decrease of blood flow in one lung or both lungs. Most blockages come from a blood clot that travels from the legs or the pelvis to the lungs. PE is a dangerous and potentially life-threatening condition if it is not treated right away. What are the causes? A pulmonary embolism occurs most commonly when a blood clot travels from one of your veins to your lungs. Rarely, PE is caused by air, fat, amniotic fluid, or part of a tumor traveling through your veins to your lungs. What increases the risk? A PE is more likely to develop in:  People who smoke.  People who areolder, especially over 28 years of age.  People who are overweight (obese).  People who sit or lie still for a long time, such as during long-distance travel (over 4 hours), bed rest, hospitalization, or during recovery from certain medical conditions like a stroke.  People who do not engage in much physical activity (sedentary lifestyle).  People who have chronic breathing disorders.  People whohave a personal or family history of blood clots or blood clotting disease.  People whohave peripheral vascular disease (PVD), diabetes, or some types of cancer.  People who haveheart disease, especially if the person had a recent heart attack or has congestive heart failure.  People who have neurological diseases that affect the legs (leg paresis).  People who have had a traumatic injury, such as breaking a hip or leg.  People whohave recently had major or lengthy surgery, especially on the hip, knee, or abdomen.  People who have hada central line placed inside a large vein.  People who takemedicines that contain the hormone estrogen. These include birth control pills and hormone replacement therapy.  Pregnancy or during childbirth or the postpartum  period.  What are the signs or symptoms? The symptoms of a PE usually start suddenly and include:  Shortness of breath while active or at rest.  Coughing or coughing up blood or blood-tinged mucus.  Chest pain that is often worse with deep breaths.  Rapid or irregular heartbeat.  Feeling light-headed or dizzy.  Fainting.  Feelinganxious.  Sweating.  There may also be pain and swelling in a leg if that is where the blood clot started. These symptoms may represent a serious problem that is an emergency. Do not wait to see if the symptoms will go away. Get medical help right away. Call your local emergency services (911 in the [place] Do not drive yourself to the hospital. How is this diagnosed? Your health care provider will take a medical history and perform a physical exam. You may also have other tests, including:  Blood tests to assess the clotting properties of your blood, assess oxygen levels in your blood, and find blood clots.  Imaging tests, such as CT, ultrasound, MRI, X-ray, and other tests to see if you have clots anywhere in your body.  An electrocardiogram (ECG) to look for heart strain from blood clots in the lungs.  How is this treated? The main goals of PE treatment are:  To stop a blood clot from growing larger.  To stop new blood clots from forming.  The type of treatment that you receive depends on many factors, such as the cause of your PE, your risk for bleeding or developing more clots, and other medical conditions  that you have. Sometimes, a combination of treatments is necessary. This condition may be treated with:  Medicines, including newer oral blood thinners (anticoagulants), warfarin, low molecular weight heparins, thrombolytics, or heparins.  Wearing compression stockings or using different types of devices.  Surgery (rare) to remove the blood clot or to place a filter in your abdomen to stop the blood clot from traveling to your  lungs.  Treatments for a PE are often divided into immediate treatment, long-term treatment (up to 3 months after PE), and extended treatment (more than 3 months after PE). Your treatment may continue for several months. This is called maintenance therapy, and it is used to prevent the forming of new blood clots. You can work with your health care provider to choose the treatment program that is best for you. What are anticoagulants? Anticoagulants are medicines that treat PEs. They can stop current blood clots from growing and stop new clots from forming. They cannot dissolve existing clots. Your body dissolves clots by itself over time. Anticoagulants are given by mouth, by injection, or through an IV tube. What are thrombolytics? Thrombolytics are clot-dissolving medicines that are used to dissolve a PE. They carry a high risk of bleeding, so they tend to be used only in severe cases or if you have very low blood pressure. Follow these instructions at home: If you are taking a newer oral anticoagulant:  Take the medicine every single day at the same time each day.  Understand what foods and drugs interact with this medicine.  Understand that there are no regular blood tests required when using this medicine.  Understandthe side effects of this medicine, including excessive bruising or bleeding. Ask your health care provider or pharmacist about other possible side effects. If you are taking warfarin:  Understand how to take warfarin and know which foods can affect how warfarin works in Public relations account executiveyour body.  Understand that it is dangerous to taketoo much or too little warfarin. Too much warfarin increases the risk of bleeding. Too little warfarin continues to allow the risk for blood clots.  Follow your PT and INR blood testing schedule. The PT and INR results allow your health care provider to adjust your dose of warfarin. It is very important that you have your PT and INR tested as often as told  by your health care provider.  Avoid major changes in your diet, or tell your health care provider before you change your diet. Arrange a visit with a registered dietitian to answer your questions. Many foods, especially foods that are high in vitamin K, can interfere with warfarin and affect the PT and INR results. Eat a consistent amount of foods that are high in vitamin K, such as: ? Spinach, kale, broccoli, cabbage, collard greens, turnip greens, Brussels sprouts, peas, cauliflower, seaweed, and parsley. ? Beef liver and pork liver. ? Green tea. ? Soybean oil.  Tell your health care provider about any and all medicines, vitamins, and supplements that you take, including aspirin and other over-the-counter anti-inflammatory medicines. Be especially cautious with aspirin and anti-inflammatory medicines. Do not take those before you ask your health care provider if it is safe to do so. This is important because many medicines can interfere with warfarin and affect the PT and INR results.  Do not start or stop taking any over-the-counter or prescription medicine unless your health care provider or pharmacist tells you to do so. If you take warfarin, you will also need to do these things:  Hold pressure  over cuts for longer than usual.  Tell your dentist and other health care providers that you are taking warfarin before you have any procedures in which bleeding may occur.  Avoid alcohol or drink very small amounts. Tell your health care provider if you change your alcohol intake.  Do not use tobacco products, including cigarettes, chewing tobacco, and e-cigarettes. If you need help quitting, ask your health care provider.  Avoid contact sports.  General instructions  Take over-the-counter and prescription medicines only as told by your health care provider. Anticoagulant medicines can have side effects, including easy bruising and difficulty stopping bleeding. If you are prescribed an  anticoagulant, you will also need to do these things: ? Hold pressure over cuts for longer than usual. ? Tell your dentist and other health care providers that you are taking anticoagulants before you have any procedures in which bleeding may occur. ? Avoid contact sports.  Wear a medical alert bracelet or carry a medical alert card that says you have had a PE.  Ask your health care provider how soon you can go back to your normal activities. Stay active to prevent new blood clots from forming.  Make sure to exercise while traveling or when you have been sitting or standing for a long period of time. It is very important to exercise. Exercise your legs by walking or by tightening and relaxing your leg muscles often. Take frequent walks.  Wear compression stockings as told by your health care provider to help prevent more blood clots from forming.  Do not use tobacco products, including cigarettes, chewing tobacco, and e-cigarettes. If you need help quitting, ask your health care provider.  Keep all follow-up appointments with your health care provider. This is important. How is this prevented? Take these actions to decrease your risk of developing another PE:  Exercise regularly. For at least 30 minutes every day, engage in: ? Activity that involves moving your arms and legs. ? Activity that encourages good blood flow through your body by increasing your heart rate.  Exercise your arms and legs every hour during long-distance travel (over 4 hours). Drink plenty of water and avoid drinking alcohol while traveling.  Avoid sitting or lying in bed for long periods of time without moving your legs.  Maintain a weight that is appropriate for your height. Ask your health care provider what weight is healthy for you.  If you are a woman who is over 89 years of age, avoid unnecessary use of medicines that contain estrogen. These include birth control pills.  Do not smoke, especially if you take  estrogen medicines. If you need help quitting, ask your health care provider.  If you are at very high risk for PE, wear compression stockings.  If you recently had a PE, have regularly scheduled ultrasound testing on your legs to check for new blood clots.  If you are hospitalized, prevention measures may include:  Early walking after surgery, as soon as your health care provider says that it is safe.  Receiving anticoagulants to prevent blood clots. If you cannot take anticoagulants, other options may be available, such as wearing compression stockings or using different types of devices.  Get help right away if:  You have new or increased pain, swelling, or redness in an arm or leg.  You have numbness or tingling in an arm or leg.  You have shortness of breath while active or at rest.  You have chest pain.  You have a rapid or  irregular heartbeat.  You feel light-headed or dizzy.  You cough up blood.  You notice blood in your vomit, bowel movement, or urine.  You have a fever. These symptoms may represent a serious problem that is an emergency. Do not wait to see if the symptoms will go away. Get medical help right away. Call your local emergency services (911 in the [place] Do not drive yourself to the hospital. This information is not intended to replace advice given to you by your health care provider. Make sure you discuss any questions you have with your health care provider. Document Released: 03/03/2000 Document Revised: 08/12/2015 Document Reviewed: 07/01/2014 Elsevier Interactive Patient Education  2017 [employer].

## 2016-08-22 NOTE — Progress Notes (Signed)
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 Jeremiah [name] was admitted to inpatient services on 08/09/2016 after presenting to the emergency department with left side pleuritis chest pain and left leg swelling for 3 weeks. He was found to have DVT and pulmonary embolism. CT angiogram of the chest showed bilateral pulmonary embolism with a moderate clot burden. He was treated with IV Heparin. He was discharged home on Xarelto. Jeremiah [name] has been taking medication consistently.  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  He continues to smoke. He says that he has decrease smoking and is down to 3 cigarettes per day.  He denies fatigue, bleeding,  swelling or pain.   Past Medical History:  Diagnosis Date  . Anemia   . Asthma    as a child    Social History   Social History  . Marital status: Single    Spouse name: N/A  . Number of children: N/A  . Years of education: N/A   Occupational History  . Not on file.   Social History Main Topics  . Smoking status: Current Every Day Smoker    Packs/day: 0.25    Types: Cigarettes  . Smokeless tobacco: Never Used  . Alcohol use 9.0 oz/week    15 Cans of beer per week  . Drug use: No  . Sexual activity: Not on file   Other Topics Concern  . Not on file   Social History Narrative  . No narrative on file   Review of Systems  Constitutional: Negative.  Negative for diaphoresis.  HENT: Negative.   Eyes: Negative.   Respiratory: Negative.   Cardiovascular: Negative.   Gastrointestinal: Negative.  Negative for blood in stool and melena.  Genitourinary: Negative.  Negative for dysuria, frequency and hematuria.  Musculoskeletal: Negative.   Skin: Negative.   Neurological: Negative.  Negative for weakness.  Endo/Heme/Allergies: Negative.   Psychiatric/Behavioral: Negative.   Physical Exam  Constitutional: He is oriented to person, place, and time and well-developed, well-nourished, and in  no distress.  HENT:  Head: Normocephalic and atraumatic.  Right Ear: External ear normal.  Left Ear: External ear normal.  Nose: Nose normal.  Mouth/Throat: Oropharynx is clear and moist.  Eyes: Conjunctivae and EOM are normal. Pupils are equal, round, and reactive to light.  Neck: Normal range of motion. Neck supple.  Cardiovascular: Normal rate, regular rhythm, normal heart sounds and intact distal pulses.   Pulmonary/Chest: Effort normal and breath sounds normal.  Abdominal: Soft. Bowel sounds are normal.  Musculoskeletal: Normal range of motion.  Neurological: He is alert and oriented to person, place, and time. Gait normal.  Skin: Skin is warm and dry.  Psychiatric: Mood, memory, affect and judgment normal.     Plan   1. Pulmonary embolism, bilateral (HCC) Discussed potential bleeding risks while taking Xarelto. Will follow up in 3 months.  - rivaroxaban (XARELTO) 20 MG TABS tablet; Take 1 tablet (20 mg total) by mouth daily with supper.  Dispense: 90 tablet; Refill: 1  2. Leukocytosis, unspecified type Reviewed previous labs, mild leukocytosis. Will recheck  - CBC with Differential  3. Need for Tdap vaccination - Tdap vaccine greater than or equal to 7yo IM  4. Tobacco dependence Smoking cessation instruction/counseling given:  counseled patient on the dangers of tobacco use, advised patient to stop smoking, and reviewed strategies to maximize success    RTC: 2 months for PE/DVT   Nolon Nations  MSN, FNP-C [facility] [address]  [place], [place] [id number] [phone]

## 2016-08-24 ENCOUNTER — Ambulatory Visit: Payer: Self-pay | Admitting: Family Medicine

## 2016-09-05 ENCOUNTER — Encounter: Payer: Self-pay | Admitting: Family Medicine

## 2016-09-05 ENCOUNTER — Ambulatory Visit (INDEPENDENT_AMBULATORY_CARE_PROVIDER_SITE_OTHER): Payer: Self-pay | Admitting: Family Medicine

## 2016-09-05 VITALS — BP 128/78 | HR 90 | Temp 98.4°F | Resp 16 | Ht 67.0 in | Wt 181.0 lb

## 2016-09-05 DIAGNOSIS — G629 Polyneuropathy, unspecified: Secondary | ICD-10-CM

## 2016-09-05 DIAGNOSIS — R42 Dizziness and giddiness: Secondary | ICD-10-CM

## 2016-09-05 DIAGNOSIS — I2699 Other pulmonary embolism without acute cor pulmonale: Secondary | ICD-10-CM

## 2016-09-05 DIAGNOSIS — F17200 Nicotine dependence, unspecified, uncomplicated: Secondary | ICD-10-CM

## 2016-09-05 LAB — POCT URINALYSIS DIP (DEVICE)
BILIRUBIN URINE: NEGATIVE
Glucose, UA: NEGATIVE mg/dL
Ketones, ur: NEGATIVE mg/dL
LEUKOCYTES UA: NEGATIVE
Nitrite: NEGATIVE
PH: 5.5 (ref 5.0–8.0)
Protein, ur: NEGATIVE mg/dL
SPECIFIC GRAVITY, URINE: 1.02 (ref 1.005–1.030)
Urobilinogen, UA: 0.2 mg/dL (ref 0.0–1.0)

## 2016-09-05 LAB — POCT GLYCOSYLATED HEMOGLOBIN (HGB A1C): Hemoglobin A1C: 5.5

## 2016-09-05 NOTE — Patient Instructions (Signed)
Dizziness Dizziness is a common problem. It is a feeling of unsteadiness or light-headedness. You may feel like you are about to faint. Dizziness can lead to injury if you stumble or fall. Anyone can become dizzy, but dizziness is more common in older adults. This condition can be caused by a number of things, including medicines, dehydration, or illness. Follow these instructions at home: Taking these steps may help with your condition: Eating and drinking  Drink enough fluid to keep your urine clear or pale yellow. This helps to keep you from becoming dehydrated. Try to drink more clear fluids, such as water.  Do not drink alcohol.  Limit your caffeine intake if directed by your health care provider.  Limit your salt intake if directed by your health care provider. Activity  Avoid making quick movements. ? Rise slowly from chairs and steady yourself until you feel okay. ? In the morning, first sit up on the side of the bed. When you feel okay, stand slowly while you hold onto something until you know that your balance is fine.  Move your legs often if you need to stand in one place for a long time. Tighten and relax your muscles in your legs while you are standing.  Do not drive or operate heavy machinery if you feel dizzy.  Avoid bending down if you feel dizzy. Place items in your home so that they are easy for you to reach without leaning over. Lifestyle  Do not use any tobacco products, including cigarettes, chewing tobacco, or electronic cigarettes. If you need help quitting, ask your health care provider.  Try to reduce your stress level, such as with yoga or meditation. Talk with your health care provider if you need help. General instructions  Watch your dizziness for any changes.  Take medicines only as directed by your health care provider. Talk with your health care provider if you think that your dizziness is caused by a medicine that you are taking.  Tell a friend or  a family member that you are feeling dizzy. If he or she notices any changes in your behavior, have this person call your health care provider.  Keep all follow-up visits as directed by your health care provider. This is important. Contact a health care provider if:  Your dizziness does not go away.  Your dizziness or light-headedness gets worse.  You feel nauseous.  You have reduced hearing.  You have new symptoms.  You are unsteady on your feet or you feel like the room is spinning. Get help right away if:  You vomit or have diarrhea and are unable to eat or drink anything.  You have problems talking, walking, swallowing, or using your arms, hands, or legs.  You feel generally weak.  You are not thinking clearly or you have trouble forming sentences. It may take a friend or family member to notice this.  You have chest pain, abdominal pain, shortness of breath, or sweating.  Your vision changes.  You notice any bleeding.  You have a headache.  You have neck pain or a stiff neck.  You have a fever. This information is not intended to replace advice given to you by your health care provider. Make sure you discuss any questions you have with your health care provider. Document Released: 08/30/2000 Document Revised: 08/12/2015 Document Reviewed: 03/02/2014 Elsevier Interactive Patient Education  2017 Else[employer] Inc[employer]  Health Maintenance, Male A healthy lifestyle and preventive care is important for your health and wellness. Ask  your health care provider about what schedule of regular examinations is right for you. What should I know about weight and diet? Eat a Healthy Diet  Eat plenty of vegetables, fruits, whole grains, low-fat dairy products, and lean protein.  Do not eat a lot of foods high in solid fats, added sugars, or salt.  Maintain a Healthy Weight Regular exercise can help you achieve or maintain a healthy weight. You should:  Do at least 150 minutes of  exercise each week. The exercise should increase your heart rate and make you sweat (moderate-intensity exercise).  Do strength-training exercises at least twice a week.  Watch Your Levels of Cholesterol and Blood Lipids  Have your blood tested for lipids and cholesterol every 5 years starting at 42 years of age. If you are at high risk for heart disease, you should start having your blood tested when you are 42 years old. You may need to have your cholesterol levels checked more often if: ? Your lipid or cholesterol levels are high. ? You are older than 42 years of age. ? You are at high risk for heart disease.  What should I know about cancer screening? Many types of cancers can be detected early and may often be prevented. Lung Cancer  You should be screened every year for lung cancer if: ? You are a current smoker who has smoked for at least 30 years. ? You are a former smoker who has quit within the past 15 years.  Talk to your health care provider about your screening options, when you should start screening, and how often you should be screened.  Colorectal Cancer  Routine colorectal cancer screening usually begins at 42 years of age and should be repeated every 5-10 years until you are 42 years old. You may need to be screened more often if early forms of precancerous polyps or small growths are found. Your health care provider may recommend screening at an earlier age if you have risk factors for colon cancer.  Your health care provider may recommend using home test kits to check for hidden blood in the stool.  A small camera at the end of a tube can be used to examine your colon (sigmoidoscopy or colonoscopy). This checks for the earliest forms of colorectal cancer.  Prostate and Testicular Cancer  Depending on your age and overall health, your health care provider may do certain tests to screen for prostate and testicular cancer.  Talk to your health care provider about  any symptoms or concerns you have about testicular or prostate cancer.  Skin Cancer  Check your skin from head to toe regularly.  Tell your health care provider about any new moles or changes in moles, especially if: ? There is a change in a mole's size, shape, or color. ? You have a mole that is larger than a pencil eraser.  Always use sunscreen. Apply sunscreen liberally and repeat throughout the day.  Protect yourself by wearing long sleeves, pants, a wide-brimmed hat, and sunglasses when outside.  What should I know about heart disease, diabetes, and high blood pressure?  If you are 10618-42 years of age, have your blood pressure checked every 3-5 years. If you are 42 years of age or older, have your blood pressure checked every year. You should have your blood pressure measured twice-once when you are at a hospital or clinic, and once when you are not at a hospital or clinic. Record the average of the two measurements. To  check your blood pressure when you are not at a hospital or clinic, you can use: ? An automated blood pressure machine at a pharmacy. ? A home blood pressure monitor.  Talk to your health care provider about your target blood pressure.  If you are between 38-51 years old, ask your health care provider if you should take aspirin to prevent heart disease.  Have regular diabetes screenings by checking your fasting blood sugar level. ? If you are at a normal weight and have a low risk for diabetes, have this test once every three years after the age of 46. ? If you are overweight and have a high risk for diabetes, consider being tested at a younger age or more often.  A one-time screening for abdominal aortic aneurysm (AAA) by ultrasound is recommended for men aged 65-75 years who are current or former smokers. What should I know about preventing infection? Hepatitis B If you have a higher risk for hepatitis B, you should be screened for this virus. Talk with your  health care provider to find out if you are at risk for hepatitis B infection. Hepatitis C Blood testing is recommended for:  Everyone born from [birth year] through 1966.  Anyone with known risk factors for hepatitis C.  Sexually Transmitted Diseases (STDs)  You should be screened each year for STDs including gonorrhea and chlamydia if: ? You are sexually active and are younger than 42 years of age. ? You are older than 42 years of age and your health care provider tells you that you are at risk for this type of infection. ? Your sexual activity has changed since you were last screened and you are at an increased risk for chlamydia or gonorrhea. Ask your health care provider if you are at risk.  Talk with your health care provider about whether you are at high risk of being infected with HIV. Your health care provider may recommend a prescription medicine to help prevent HIV infection.  What else can I do?  Schedule regular health, dental, and eye exams.  Stay current with your vaccines (immunizations).  Do not use any tobacco products, such as cigarettes, chewing tobacco, and e-cigarettes. If you need help quitting, ask your health care provider.  Limit alcohol intake to no more than 2 drinks per day. One drink equals 12 ounces of beer, 5 ounces of wine, or 1 ounces of hard liquor.  Do not use street drugs.  Do not share needles.  Ask your health care provider for help if you need support or information about quitting drugs.  Tell your health care provider if you often feel depressed.  Tell your health care provider if you have ever been abused or do not feel safe at home. This information is not intended to replace advice given to you by your health care provider. Make sure you discuss any questions you have with your health care provider. Document Released: 09/02/2007 Document Revised: 11/03/2015 Document Reviewed: 12/08/2014 Elsevier Interactive Patient Education  2018 [employer].  Preventing Hypertension Hypertension, commonly called high blood pressure, is when the force of blood pumping through the arteries is too strong. Arteries are blood vessels that carry blood from the heart throughout the body. Over time, hypertension can damage the arteries and decrease blood flow to important parts of the body, including the brain, heart, and kidneys. Often, hypertension does not cause symptoms until blood pressure is very high. For this reason, it is important to have your blood pressure  checked on a regular basis. Hypertension can often be prevented with diet and lifestyle changes. If you already have hypertension, you can control it with diet and lifestyle changes, as well as medicine. What nutrition changes can be made? Maintain a healthy diet. This includes:  Eating less salt (sodium). Ask your health care provider how much sodium is safe for you to have. The general recommendation is to consume less than 1 tsp (2,300 mg) of sodium a day. ? Do not add salt to your food. ? Choose low-sodium options when grocery shopping and eating out.  Limiting fats in your diet. You can do this by eating low-fat or fat-free dairy products and by eating less red meat.  Eating more fruits, vegetables, and whole grains. Make a goal to eat: ? 1-2 cups of fresh fruits and vegetables each day. ? 3-4 servings of whole grains each day.  Avoiding foods and beverages that have added sugars.  Eating fish that contain healthy fats (omega-3 fatty acids), such as mackerel or salmon.  If you need help putting together a healthy eating plan, try the DASH diet. This diet is high in fruits, vegetables, and whole grains. It is low in sodium, red meat, and added sugars. DASH stands for Dietary Approaches to Stop Hypertension. What lifestyle changes can be made?  Lose weight if you are overweight. Losing just 3?5% of your body weight can help prevent or control hypertension. ? For example, if your  present weight is 200 lb (91 kg), a loss of 3-5% of your weight means losing 6-10 lb (2.7-4.5 kg). ? Ask your health care provider to help you with a diet and exercise plan to safely lose weight.  Get enough exercise. Do at least 150 minutes of moderate-intensity exercise each week. ? You could do this in short exercise sessions several times a day, or you could do longer exercise sessions a few times a week. For example, you could take a brisk 10-minute walk or bike ride, 3 times a day, for 5 days a week.  Find ways to reduce stress, such as exercising, meditating, listening to music, or taking a yoga class. If you need help reducing stress, ask your health care provider.  Do not smoke. This includes e-cigarettes. Chemicals in tobacco and nicotine products raise your blood pressure each time you smoke. If you need help quitting, ask your health care provider.  Avoid alcohol. If you drink alcohol, limit alcohol intake to no more than 1 drink a day for nonpregnant women and 2 drinks a day for men. One drink equals 12 oz of beer, 5 oz of wine, or 1 oz of hard liquor. Why are these changes important? Diet and lifestyle changes can help you prevent hypertension, and they may make you feel better overall and improve your quality of life. If you have hypertension, making these changes will help you control it and help prevent major complications, such as:  Hardening and narrowing of arteries that supply blood to: ? Your heart. This can cause a heart attack. ? Your brain. This can cause a stroke. ? Your kidneys. This can cause kidney failure.  Stress on your heart muscle, which can cause heart failure.  What can I do to lower my risk?  Work with your health care provider to make a hypertension prevention plan that works for you. Follow your plan and keep all follow-up visits as told by your health care provider.  Learn how to check your blood pressure at home. Make  sure that you know your personal  target blood pressure, as told by your health care provider. How is this treated? In addition to diet and lifestyle changes, your health care provider may recommend medicines to help lower your blood pressure. You may need to try a few different medicines to find what works best for you. You also may need to take more than one medicine. Take over-the-counter and prescription medicines only as told by your health care provider. Where to find support: Your health care provider can help you prevent hypertension and help you keep your blood pressure at a healthy level. Your local hospital or your community may also provide support services and prevention programs. The American Heart Association offers an online support network at: https://www.lee.net/ Where to find more information: Learn more about hypertension from:  National Heart, Lung, and Blood Institute: https://www.peterson.org/  Centers for Disease Control and Prevention: AboutHD.co.nz  American Academy of Family Physicians: http://familydoctor.org/familydoctor/en/diseases-conditions/high-blood-pressure.printerview.all.html  Learn more about the DASH diet from:  National Heart, Lung, and Blood Institute: WedMap.it  Contact a health care provider if:  You think you are having a reaction to medicines you have taken.  You have recurrent headaches or feel dizzy.  You have swelling in your ankles.  You have trouble with your vision. Summary  Hypertension often does not cause any symptoms until blood pressure is very high. It is important to get your blood pressure checked regularly.  Diet and lifestyle changes are the most important steps in preventing hypertension.  By keeping your blood pressure in a healthy range, you can prevent complications like heart attack, heart failure, stroke, and kidney failure.  Work with your  health care provider to make a hypertension prevention plan that works for you. This information is not intended to replace advice given to you by your health care provider. Make sure you discuss any questions you have with your health care provider. Document Released: 03/21/2015 Document Revised: 11/15/2015 Document Reviewed: 11/15/2015 Elsevier Interactive Patient Education  2017 [employer].

## 2016-09-05 NOTE — Progress Notes (Signed)
[employer], [employer], a 41 year old male with a history of PE/DVT presents complaining of occasional dizziness. Jeremiah [name] states that he recently changed jobs and has felt dizzy over the past several days. 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 He describes dizziness as "light headed".   Patient denies headache, shortness of breath, chest pain, hearing loss, visual disturbance, dysuria, nausea, vomiting, or diarrhea.   Jeremiah [name] was also admitted to inpatient services on 08/09/2016 after presenting to the emergency department with left side pleuritis chest pain and left leg swelling for 3 weeks. He was found to have DVT and pulmonary embolism. CT angiogram of the chest showed bilateral pulmonary embolism with a moderate clot burden. He was treated with IV Heparin. He was discharged home on Xarelto. Jeremiah [name] has been taking medication consistently.  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  He continues to smoke. He says that he has decrease smoking and is down to 3 cigarettes per day.   Past Medical History:  Diagnosis Date  . Anemia   . Asthma    as a child    Social History   Social History  . Marital status: Single    Spouse name: N/A  . Number of children: N/A  . Years of education: N/A   Occupational History  . Not on file.   Social History Main Topics  . Smoking status: Current Every Day Smoker    Packs/day: 0.25    Types: Cigarettes  . Smokeless tobacco: Never Used  . Alcohol use 9.0 oz/week    15 Cans of beer per week  . Drug use: No  . Sexual activity: Not on file   Other Topics Concern  . Not on file   Social History Narrative  . No narrative on file   Review of Systems  Constitutional: Negative.  Negative for diaphoresis.  HENT: Negative.   Eyes: Negative.   Respiratory: Negative.   Cardiovascular: Negative.   Gastrointestinal: Negative.  Negative for blood in stool and melena.   Genitourinary: Negative.  Negative for dysuria, frequency and hematuria.  Musculoskeletal: Negative.   Skin: Negative.   Neurological: Positive for tingling (left leg). Negative for weakness.  Endo/Heme/Allergies: Negative.   Psychiatric/Behavioral: Negative.   Physical Exam  Constitutional: He is oriented to person, place, and time and well-developed, well-nourished, and in no distress.  HENT:  Head: Normocephalic and atraumatic.  Right Ear: External ear normal.  Left Ear: External ear normal.  Nose: Nose normal.  Mouth/Throat: Oropharynx is clear and moist.  Eyes: Conjunctivae and EOM are normal. Pupils are equal, round, and reactive to light.  Neck: Normal range of motion. Neck supple.  Cardiovascular: Normal rate, regular rhythm, normal heart sounds and intact distal pulses.   Pulmonary/Chest: Effort normal and breath sounds normal.  Abdominal: Soft. Bowel sounds are normal.  Musculoskeletal: Normal range of motion.  Neurological: He is alert and oriented to person, place, and time. Gait normal.  Skin: Skin is warm and dry.  Psychiatric: Mood, memory, affect and judgment normal.    BP 136/81 (BP Location: Right Arm, Patient Position: Sitting, Cuff Size: Normal)   Pulse 90   Temp 98.4 F (36.9 C) (Oral)   Resp 16   Ht 5\' 7"  (1.702 m)   Wt 181 lb (82.1 kg)   SpO2 99%   BMI 28.35 kg/m  Plan   1. Dizziness Orthostatic vital signs within normal  limits.Dizziness is not reproducible on physical exam.  I suspect that occasional dizziness is related to dehydration. Patient recently started working outdoors. I recommend that he increase water intake to 6-8 glasses per day.   2. Neuropathy He reports occasional neuropathy to left leg. Reviewed labs, no acute findings.  - HgB A1c  3. Pulmonary embolism, bilateral (HCC) Discussed potential bleeding risks while taking Xarelto. Will follow up in 3 months.  - rivaroxaban (XARELTO) 20 MG TABS tablet; Take 1 tablet (20 mg total) by  mouth daily with supper.  Dispense: 90 tablet; Refill: 1 reater than or equal to 42yo IM  4. Tobacco dependence Smoking cessation instruction/counseling given:  counseled patient on the dangers of tobacco use, advised patient to stop smoking, and reviewed strategies to maximize success    RTC: 2 months for PE/DVT   Jeremiah NationsLaChina Moore Hollis  MSN, FNP-C [facility] [address]  [place], [place] [id number] [phone]

## 2016-09-06 ENCOUNTER — Other Ambulatory Visit: Payer: Self-pay

## 2016-09-06 DIAGNOSIS — I2699 Other pulmonary embolism without acute cor pulmonale: Secondary | ICD-10-CM

## 2016-09-06 MED ORDER — RIVAROXABAN 20 MG PO TABS: 20.0000 mg | ORAL_TABLET | Freq: Every day | ORAL | 1 refills | Status: DC

## 2016-09-06 NOTE — Telephone Encounter (Signed)
Sent refill in for xarelto

## 2016-11-22 ENCOUNTER — Ambulatory Visit: Payer: Self-pay | Admitting: Family Medicine

## 2017-09-08 ENCOUNTER — Emergency Department (HOSPITAL_COMMUNITY): Payer: Self-pay

## 2017-09-08 ENCOUNTER — Encounter (HOSPITAL_COMMUNITY): Payer: Self-pay

## 2017-09-08 ENCOUNTER — Other Ambulatory Visit: Payer: Self-pay

## 2017-09-08 ENCOUNTER — Emergency Department (HOSPITAL_COMMUNITY)
Admission: EM | Admit: 2017-09-08 | Discharge: 2017-09-08 | Disposition: A | Discharge status: Home / Self Care | Payer: Self-pay | Attending: Emergency Medicine | Admitting: Emergency Medicine

## 2017-09-08 DIAGNOSIS — M549 Dorsalgia, unspecified: Secondary | ICD-10-CM | POA: Insufficient documentation

## 2017-09-08 DIAGNOSIS — R0789 Other chest pain: Secondary | ICD-10-CM | POA: Insufficient documentation

## 2017-09-08 DIAGNOSIS — F17210 Nicotine dependence, cigarettes, uncomplicated: Secondary | ICD-10-CM | POA: Insufficient documentation

## 2017-09-08 DIAGNOSIS — J45909 Unspecified asthma, uncomplicated: Secondary | ICD-10-CM | POA: Insufficient documentation

## 2017-09-08 DIAGNOSIS — Z86718 Personal history of other venous thrombosis and embolism: Secondary | ICD-10-CM | POA: Insufficient documentation

## 2017-09-08 LAB — CBC
HEMATOCRIT: 46.8 % (ref 39.0–52.0)
HEMOGLOBIN: 16.7 g/dL (ref 13.0–17.0)
MCH: 31.8 pg (ref 26.0–34.0)
MCHC: 35.7 g/dL (ref 30.0–36.0)
MCV: 89.1 fL (ref 78.0–100.0)
Platelets: 202 10*3/uL (ref 150–400)
RBC: 5.25 MIL/uL (ref 4.22–5.81)
RDW: 13.1 % (ref 11.5–15.5)
WBC: 7.7 10*3/uL (ref 4.0–10.5)

## 2017-09-08 LAB — BASIC METABOLIC PANEL
ANION GAP: 6 (ref 5–15)
BUN: 14 mg/dL (ref 6–20)
CO2: 26 mmol/L (ref 22–32)
Calcium: 9.1 mg/dL (ref 8.9–10.3)
Chloride: 109 mmol/L (ref 101–111)
Creatinine, Ser: 1.24 mg/dL (ref 0.61–1.24)
Glucose, Bld: 95 mg/dL (ref 65–99)
POTASSIUM: 4 mmol/L (ref 3.5–5.1)
SODIUM: 141 mmol/L (ref 135–145)

## 2017-09-08 LAB — I-STAT TROPONIN, ED: Troponin i, poc: 0 ng/mL (ref 0.00–0.08)

## 2017-09-08 MED ORDER — IOPAMIDOL (ISOVUE-370) INJECTION 76%
100.0000 mL | Freq: Once | INTRAVENOUS | Status: AC | PRN
  Administered 2017-09-08: 100 mL via INTRAVENOUS

## 2017-09-08 MED ORDER — IOPAMIDOL (ISOVUE-370) INJECTION 76%
INTRAVENOUS | Status: AC
  Filled 2017-09-08: qty 100

## 2017-09-08 MED ORDER — IBUPROFEN 600 MG PO TABS: 600.0000 mg | ORAL_TABLET | Freq: Four times a day (QID) | ORAL | 0 refills | Status: DC | PRN

## 2017-09-08 MED ORDER — KETOROLAC TROMETHAMINE 30 MG/ML IJ SOLN
30.0000 mg | Freq: Once | INTRAMUSCULAR | Status: AC
  Administered 2017-09-08: 30 mg via INTRAVENOUS
  Filled 2017-09-08: qty 1

## 2017-09-08 NOTE — ED Provider Notes (Signed)
[facility]-EMERGENCY DEPT Provider Note   CSN: [id number] Arrival date & time: 09/08/17  1136     History   Chief Complaint Chief Complaint  Patient presents with  . Chest Pain  . Back Pain    HPI Jeremiah [name] is a 42 y.o. male.  Pt presents to the ED today with left sided chest and back pain.  The pt said sx started 2 days ago.  He said he has sob as well.  Pt was admitted in May of 2018 with DVT and PE.  He was d/c home on Xarelto which he has not been taking in several months.  He took himself off the medication b/c he could not afford it. The pt said his pain today is c/w pain he had with PE.     Past Medical History:  Diagnosis Date  . Anemia   . Asthma    as a child     Patient Active Problem List   Diagnosis Date Noted  . Pulmonary embolism, bilateral (HCC) 08/09/2016  . Acute deep vein thrombosis (DVT) of lower extremity (HCC) 08/09/2016  . Pulmonary embolism (HCC) 08/09/2016  . Annual physical exam 02/06/2015  . Immunization due 02/06/2015    History reviewed. No pertinent surgical history.      Home Medications    Prior to Admission medications   Medication Sig Start Date End Date Taking? Authorizing Provider  ibuprofen (ADVIL,MOTRIN) 600 MG tablet Take 1 tablet (600 mg total) by mouth every 6 (six) hours as needed. 09/08/17   Jacalyn Lefevre, MD  oxyCODONE (ROXICODONE) 15 MG immediate release tablet Take 1 tablet (15 mg total) by mouth every 8 (eight) hours as needed for pain. Patient not taking: Reported on 08/22/2016 08/13/16   Penny Pia, MD  Rivaroxaban (XARELTO STARTER PACK) 15 & 20 MG TBPK Take as directed on package: Start with one 15mg  tablet by mouth twice a day with food. On Day 22, switch to one 20mg  tablet once a day with food. Patient not taking: Reported on 09/05/2016 08/12/16   Penny Pia, MD  rivaroxaban (XARELTO) 20 MG TABS tablet Take 1 tablet (20 mg total) by mouth daily with supper. Patient not taking:  Reported on 09/08/2017 09/06/16   Massie Maroon, FNP  senna (SENOKOT) 8.6 MG TABS tablet Take 1 tablet (8.6 mg total) by mouth daily. Patient not taking: Reported on 08/22/2016 08/13/16   Penny Pia, MD    Family History Family History  Problem Relation Age of Onset  . Diabetes Father   . Hypertension Father   . Diabetes Brother   . Hypertension Brother   . Diabetes Paternal Aunt   . Hypertension Paternal Aunt   . Diabetes Paternal Grandmother     Social History Social History   Tobacco Use  . Smoking status: Current Every Day Smoker    Packs/day: 0.25    Types: Cigarettes  . Smokeless tobacco: Never Used  Substance Use Topics  . Alcohol use: Yes    Alcohol/week: 9.0 oz    Types: 15 Cans of beer per week  . Drug use: No     Allergies   Amoxicillin and Penicillins   Review of Systems Review of Systems  Respiratory: Positive for shortness of breath.   Cardiovascular: Positive for chest pain.  All other systems reviewed and are negative.    Physical Exam Updated Vital Signs BP (!) 142/99 (BP Location: Right Arm)   Pulse 67   Temp 98 F (36.7 C) (  Oral)   Resp 16   Ht 5\' 7"  (1.702 m)   Wt 83.9 kg (185 lb)   SpO2 98%   BMI 28.98 kg/m   Physical Exam  Constitutional: He is oriented to person, place, and time. He appears well-developed and well-nourished.  HENT:  Head: Normocephalic and atraumatic.  Eyes: Pupils are equal, round, and reactive to light. EOM are normal.  Neck: Normal range of motion. Neck supple.  Cardiovascular: Normal rate, regular rhythm, intact distal pulses and normal pulses.  Pulmonary/Chest: Effort normal and breath sounds normal.  Abdominal: Soft. Bowel sounds are normal.  Musculoskeletal: Normal range of motion.       Right lower leg: Normal.       Left lower leg: Normal.  Neurological: He is alert and oriented to person, place, and time.  Skin: Skin is warm and dry. Capillary refill takes less than 2 seconds.  Psychiatric: He  has a normal mood and affect. His behavior is normal.  Nursing note and vitals reviewed.    ED Treatments / Results  Labs (all labs ordered are listed, but only abnormal results are displayed) Labs Reviewed  BASIC METABOLIC PANEL  CBC  I-STAT TROPONIN, ED    EKG EKG Interpretation  Date/Time:  Saturday September 08 2017 11:42:39 EDT Ventricular Rate:  58 PR Interval:    QRS Duration: 85 QT Interval:  404 QTC Calculation: 397 R Axis:   78 Text Interpretation:  Sinus rhythm Confirmed by Jacalyn LefevreHaviland, Rawlins Stuard [id number]) on 09/08/2017 11:59:54 AM   Radiology Ct Angio Chest Pe W And/or Wo Contrast  Result Date: 09/08/2017 CLINICAL DATA:  Complaint of left chest pain radiating into the left upper back for 2 days. Kathleene Hazel/shortness of breath. History of DVT. EXAM: CT ANGIOGRAPHY CHEST WITH CONTRAST TECHNIQUE: Multidetector CT imaging of the chest was performed using the standard protocol during bolus administration of intravenous contrast. Multiplanar CT image reconstructions and MIPs were obtained to evaluate the vascular anatomy. CONTRAST:  100mL ISOVUE-370 IOPAMIDOL (ISOVUE-370) INJECTION 76% COMPARISON:  08/09/2016 FINDINGS: Cardiovascular: Satisfactory opacification of the pulmonary arteries to the segmental level. No evidence of pulmonary embolism. Normal heart size. No pericardial effusion. No coronary artery calcifications. Great vessels are normal in caliber. No aortic atherosclerosis. Mediastinum/Nodes: No enlarged mediastinal, hilar, or axillary lymph nodes. Thyroid gland, trachea, and esophagus demonstrate no significant findings. Lungs/Pleura: Minor dependent lower lobe subsegmental atelectasis, left greater than right. Lungs otherwise clear. No pleural effusion or pneumothorax. Upper Abdomen: Limited upper abdomen evaluation is within normal limits. Musculoskeletal: No chest wall abnormality. No acute or significant osseous findings. Review of the MIP images confirms the above findings. IMPRESSION: 1.  No evidence of a pulmonary embolism. 2. No acute findings. 3. Minor dependent lower lobe subsegmental atelectasis. Lungs otherwise clear. Electronically Signed   By: Amie Portlandavid  Ormond M.D.   On: 09/08/2017 13:49    Procedures Procedures (including critical care time)  Medications Ordered in ED Medications  iopamidol (ISOVUE-370) 76 % injection (has no administration in time range)  iopamidol (ISOVUE-370) 76 % injection 100 mL (100 mLs Intravenous Contrast Given 09/08/17 1310)     Initial Impression / Assessment and Plan / ED Course  I have reviewed the triage vital signs and the nursing notes.  Pertinent labs & imaging results that were available during my care of the patient were reviewed by me and considered in my medical decision making (see chart for details).    No evidence of PE on chest CT.  Pain is msk in nature.  Pt instructed to f/u with pcp to see if he needs to continue Xarelto.  REturn if worse.  Final Clinical Impressions(s) / ED Diagnoses   Final diagnoses:  Atypical chest pain    ED Discharge Orders        Ordered    ibuprofen (ADVIL,MOTRIN) 600 MG tablet  Every 6 hours PRN     09/08/17 1408       Jacalyn Lefevre, MD 09/08/17 1409

## 2017-09-08 NOTE — ED Triage Notes (Signed)
Patient c/o left chest pain that radiates into the left upper back x 2 days. Patient c/o slight SOB and left chest pain worse with palpation.

## 2018-06-07 ENCOUNTER — Emergency Department (HOSPITAL_COMMUNITY): Payer: Self-pay

## 2018-06-07 ENCOUNTER — Other Ambulatory Visit: Payer: Self-pay

## 2018-06-07 ENCOUNTER — Emergency Department (HOSPITAL_BASED_OUTPATIENT_CLINIC_OR_DEPARTMENT_OTHER): Payer: Self-pay

## 2018-06-07 ENCOUNTER — Emergency Department (HOSPITAL_COMMUNITY)
Admission: EM | Admit: 2018-06-07 | Discharge: 2018-06-07 | Disposition: A | Discharge status: Home / Self Care | Payer: Self-pay | Attending: Emergency Medicine | Admitting: Emergency Medicine

## 2018-06-07 ENCOUNTER — Encounter (HOSPITAL_COMMUNITY): Payer: Self-pay | Admitting: Pharmacy Technician

## 2018-06-07 DIAGNOSIS — I2694 Multiple subsegmental pulmonary emboli without acute cor pulmonale: Secondary | ICD-10-CM | POA: Insufficient documentation

## 2018-06-07 DIAGNOSIS — R079 Chest pain, unspecified: Secondary | ICD-10-CM

## 2018-06-07 DIAGNOSIS — F17210 Nicotine dependence, cigarettes, uncomplicated: Secondary | ICD-10-CM | POA: Insufficient documentation

## 2018-06-07 DIAGNOSIS — I2699 Other pulmonary embolism without acute cor pulmonale: Secondary | ICD-10-CM

## 2018-06-07 LAB — BASIC METABOLIC PANEL
ANION GAP: 6 (ref 5–15)
BUN: 11 mg/dL (ref 6–20)
CALCIUM: 9.1 mg/dL (ref 8.9–10.3)
CO2: 26 mmol/L (ref 22–32)
Chloride: 107 mmol/L (ref 98–111)
Creatinine, Ser: 1.3 mg/dL — ABNORMAL HIGH (ref 0.61–1.24)
Glucose, Bld: 88 mg/dL (ref 70–99)
POTASSIUM: 4.1 mmol/L (ref 3.5–5.1)
Sodium: 139 mmol/L (ref 135–145)

## 2018-06-07 LAB — D-DIMER, QUANTITATIVE (NOT AT ARMC): D DIMER QUANT: 0.8 ug{FEU}/mL — AB (ref 0.00–0.50)

## 2018-06-07 MED ORDER — IOHEXOL 350 MG/ML SOLN
75.0000 mL | Freq: Once | INTRAVENOUS | Status: AC | PRN
  Administered 2018-06-07: 75 mL via INTRAVENOUS

## 2018-06-07 MED ORDER — CYCLOBENZAPRINE HCL 5 MG PO TABS: 5.0000 mg | ORAL_TABLET | Freq: Three times a day (TID) | ORAL | 0 refills | Status: AC | PRN

## 2018-06-07 MED ORDER — OXYCODONE-ACETAMINOPHEN 5-325 MG PO TABS
1.0000 | ORAL_TABLET | Freq: Once | ORAL | Status: AC
  Administered 2018-06-07: 1 via ORAL
  Filled 2018-06-07: qty 1

## 2018-06-07 MED ORDER — RIVAROXABAN (XARELTO) VTE STARTER PACK (15 & 20 MG): ORAL_TABLET | ORAL | 0 refills | Status: DC

## 2018-06-07 MED ORDER — MORPHINE SULFATE (PF) 2 MG/ML IV SOLN
2.0000 mg | Freq: Once | INTRAVENOUS | Status: AC
  Administered 2018-06-07: 2 mg via INTRAVENOUS
  Filled 2018-06-07: qty 1

## 2018-06-07 MED ORDER — RIVAROXABAN 15 MG PO TABS
15.0000 mg | ORAL_TABLET | Freq: Once | ORAL | Status: AC
  Administered 2018-06-07: 15 mg via ORAL
  Filled 2018-06-07: qty 1

## 2018-06-07 MED ORDER — RIVAROXABAN (XARELTO) EDUCATION KIT FOR DVT/PE PATIENTS
PACK | Freq: Once | Status: AC
  Administered 2018-06-07: 17:00:00

## 2018-06-07 NOTE — ED Provider Notes (Signed)
[facility] EMERGENCY DEPARTMENT Provider Note   CSN: [id number] Arrival date & time: 06/07/18  1222    History   Chief Complaint No chief complaint on file.   HPI Jeremiah [name] is a 43 y.o. male w/ PMH of provoked PE and DVT presenting with right sided calf pain. He states he was in his usual state of health until last Saturday when he began to have acute onset cramping pain on Right calf while walking. He states he initially though it was muscle strain and waiting for the symptoms to resolve on its own but it has been 6 days with no improvement so he came to be evaluated per advice from his mother. 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 He states currently he is not working due to licensing issues and denies any long-distance drives since 06/2704. He is a current half-pack daily smoker. Denies any recent surgery, long flights, extended duration of immobility. Denies any chest pain, palpitations, dyspnea, hemoptysis or lower extremity swelling.    Past Medical History:  Diagnosis Date   Anemia    Asthma    as a child     Patient Active Problem List   Diagnosis Date Noted   Pulmonary embolism, bilateral ([place]) 08/09/2016   Acute deep vein thrombosis (DVT) of lower extremity ([place]) 08/09/2016   Pulmonary embolism ([place]) 08/09/2016   Annual physical exam 02/06/2015   Immunization due 02/06/2015   History reviewed. No pertinent surgical history.   Home Medications    Prior to Admission medications   Medication Sig Start Date End Date Taking? Authorizing Provider  cyclobenzaprine (FLEXERIL) 5 MG tablet Take 1-2 tablets (5-10 mg total) by mouth 3 (three) times daily as needed for up to 7 days for muscle spasms. 06/07/18 06/14/18  Mosetta Anis, MD  ibuprofen (ADVIL,MOTRIN) 600 MG tablet Take 1 tablet (600 mg total) by mouth every 6 (six) hours  as needed. Patient not taking: Reported on 06/07/2018 09/08/17   Isla Pence, MD  oxyCODONE (ROXICODONE) 15 MG immediate release tablet Take 1 tablet (15 mg total) by mouth every 8 (eight) hours as needed for pain. Patient not taking: Reported on 08/22/2016 08/13/16   Velvet Bathe, MD  Rivaroxaban (XARELTO STARTER PACK) 15 & 20 MG TBPK Take as directed on package: Start with one 42m tablet by mouth twice a day with food. On Day 22, switch to one 253mtablet once a day with food. 06/07/18   LeMosetta AnisMD  rivaroxaban (XARELTO) 20 MG TABS tablet Take 1 tablet (20 mg total) by mouth daily with supper. Patient not taking: Reported on 09/08/2017 09/06/16   HoDorena DewFNP  senna (SENOKOT) 8.6 MG TABS tablet Take 1 tablet (8.6 mg total) by mouth daily. Patient not taking: Reported on 08/22/2016 08/13/16   VeVelvet BatheMD    Family History Family History  Problem Relation Age of Onset   Diabetes Father    Hypertension Father    Diabetes Brother    Hypertension Brother    Diabetes Paternal Aunt    Hypertension Paternal Aunt    Diabetes Paternal Grandmother     Social History Social History   Tobacco Use   Smoking status: Current Every Day Smoker    Packs/day: 0.25    Types: Cigarettes   Smokeless tobacco: Never Used  Substance Use Topics  Alcohol use: Yes    Alcohol/week: 15.0 standard drinks    Types: 15 Cans of beer per week   Drug use: No     Allergies   Amoxicillin and Penicillins   Review of Systems Review of Systems  Constitutional: Negative for chills, fatigue and fever.  Respiratory: Negative for cough, chest tightness, shortness of breath and wheezing.   Cardiovascular: Negative for chest pain, palpitations and leg swelling.  Gastrointestinal: Negative for constipation, diarrhea, nausea and vomiting.  All other systems reviewed and are negative.    Physical Exam Updated Vital Signs BP (!) 140/92    Pulse 87    Resp 19    Ht 5' 7"  (9.381 m)     Wt 84.8 kg    SpO2 96%    BMI 29.29 kg/m   Physical Exam Constitutional:      Appearance: He is normal weight.  HENT:     Head: Normocephalic and atraumatic.     Mouth/Throat:     Mouth: Mucous membranes are moist.     Pharynx: Oropharynx is clear.  Eyes:     Comments: Injected conjunctivae  Neck:     Musculoskeletal: Normal range of motion and neck supple.  Cardiovascular:     Rate and Rhythm: Normal rate and regular rhythm.     Pulses: Normal pulses.     Heart sounds: Normal heart sounds.  Pulmonary:     Effort: Pulmonary effort is normal.     Breath sounds: Normal breath sounds.  Abdominal:     General: Abdomen is flat. Bowel sounds are normal.  Musculoskeletal: Normal range of motion.        General: Tenderness (Right calf tenderness to palpation without radiation. Area of reproducible tenderness to palpation on Left axilla) present.  Skin:    General: Skin is warm and dry.  Neurological:     General: No focal deficit present.     Mental Status: He is alert and oriented to person, place, and time.    ED Treatments / Results  Labs (all labs ordered are listed, but only abnormal results are displayed) Labs Reviewed  D-DIMER, QUANTITATIVE (NOT AT [facility]) - Abnormal; Notable for the following components:      Result Value   D-Dimer, Quant 0.80 (*)    All other components within normal limits  BASIC METABOLIC PANEL - Abnormal; Notable for the following components:   Creatinine, Ser 1.30 (*)    All other components within normal limits   EKG EKG Interpretation  Date/Time:  Friday June 07 2018 14:42:32 EDT Ventricular Rate:  73 PR Interval:    QRS Duration: 84 QT Interval:  367 QTC Calculation: 405 R Axis:   83 Text Interpretation:  Sinus rhythm Borderline T abnormalities, inferior leads Minimal ST elevation, anterior leads Baseline wander in lead(s) V1 Abnormal ekg Confirmed by Carmin Muskrat [id number]) on 06/07/2018 3:25:54 PM   Radiology Ct Angio Chest Pe W  And/or Wo Contrast  Result Date: 06/07/2018 CLINICAL DATA:  Left-sided chest pain, history of PE EXAM: CT ANGIOGRAPHY CHEST WITH CONTRAST TECHNIQUE: Multidetector CT imaging of the chest was performed using the standard protocol during bolus administration of intravenous contrast. Multiplanar CT image reconstructions and MIPs were obtained to evaluate the vascular anatomy. CONTRAST:  65m OMNIPAQUE IOHEXOL 350 MG/ML SOLN COMPARISON:  09/08/2017 FINDINGS: Cardiovascular: Thoracic aorta shows no significant atherosclerotic changes or aneurysmal dilatation. No cardiac enlargement is seen. Pulmonary artery is well visualized with a normal branching pattern. There are peripheral branches in  the lower lobes bilaterally with filling defects consistent with distal pulmonary emboli. No significant upper lobe involvement is noted. Mediastinum/Nodes: Thoracic inlet is within normal limits. No hilar or mediastinal adenopathy is noted. The esophagus as visualized is within normal limits. Lungs/Pleura: Mild emphysematous changes are identified. The lungs are well aerated with the exception of minimal pleural based densities in the left lower lobe most likely representing areas of developing pulmonary infarct. Upper Abdomen: Visualized upper abdomen shows no acute abnormality. Musculoskeletal: Bony structures are within normal limits. Review of the MIP images confirms the above findings. IMPRESSION: Changes consistent with bilateral lower lobe pulmonary emboli. There are changes along the lateral pleural margin of the left lower lobe suspicious for early changes of pulmonary infarct. Emphysema (ICD10-J43.9). Electronically Signed   By: Inez Catalina M.D.   On: 06/07/2018 15:21    Procedures Procedures (including critical care time)  Medications Ordered in ED Medications  rivaroxaban (XARELTO) Education Kit for DVT/PE patients (has no administration in time range)  iohexol (OMNIPAQUE) 350 MG/ML injection 75 mL (75 mLs  Intravenous Contrast Given 06/07/18 1451)  oxyCODONE-acetaminophen (PERCOCET/ROXICET) 5-325 MG per tablet 1 tablet (1 tablet Oral Given 06/07/18 1557)  Rivaroxaban (XARELTO) tablet 15 mg (15 mg Oral Given 06/07/18 1558)     Initial Impression / Assessment and Plan / ED Course  I have reviewed the triage vital signs and the nursing notes.  Pertinent labs & imaging results that were available during my care of the patient were reviewed by me and considered in my medical decision making (see chart for details).    Mr.Velaquez is a 43 yo M w/ PMH of provoked PE and DVT presenting with right sided calf pain. He is moderate risk for DVT due to prior history and tenderness. Will test with D-dimer and work-up further as needed. Other differential includes tendinitis or muscle cramp.      D-dimer is elevated at 0.80. Need further work-up with CTA chest and DVT ultrasound.  Final Clinical Impressions(s) / ED Diagnoses   Final diagnoses:  Multiple subsegmental pulmonary emboli without acute cor pulmonale   Mr.Napp is a 43 yo M w/ PMH of provoked PE and DVT presenting with right sided calf pain. CTA is significant for bilateral lower lung PE. Ultrasound of lower extremity showing no DVT. His calf pain is most likely due to muscular cramps, but with significant incidental findings of pulmonary embolism. While his previous episode was provoked, he has no obvious inciting factor for blood clot for this episode. He will need f/u with PCP for hypercoagulability work-up. Discharged home with Xarelto starter pack and cyclobenzaprine for muscle spasm.  ED Discharge Orders         Ordered    cyclobenzaprine (FLEXERIL) 5 MG tablet  3 times daily PRN     06/07/18 1717    Rivaroxaban (XARELTO STARTER PACK) 15 & 20 MG TBPK     06/07/18 1717           Mosetta Anis, MD 06/07/18 Jamal Maes    Carmin Muskrat, MD 06/08/18 [phone]

## 2018-06-07 NOTE — Discharge Instructions (Addendum)
Dear Jeremiah [name]  You came to [place] with calf pain. We have determined this was caused by blood clots. Here are our recommendations for you at discharge:  Please take Xarelto 15mg  twice a day for 3 weeks and then 20mg  daily Please take cyclobenzaprine 5-10mg  3 times daily  Thank you for choosing [facility].

## 2018-06-07 NOTE — ED Triage Notes (Signed)
Pt arrives via pov with reports of R calf pain onset approx 1 week ago. Today developed "twinge" in his chest. Hx PE.

## 2018-06-07 NOTE — Progress Notes (Signed)
Bilateral lower extremity venous duplex completed. Refer to "CV Proc" under chart review to view preliminary results.  06/07/2018 5:26 PM Gertie Fey, MHA, RVT, RDCS, RDMS

## 2018-06-08 ENCOUNTER — Telehealth: Payer: Self-pay | Admitting: *Deleted

## 2018-06-08 NOTE — Telephone Encounter (Signed)
Contacted [facility] # [phone] fax #[phone]. Completed MATCH with Xarelto cost override and faxed to pharmacy. This [facility] does not accept MATCH, faxed to [employer] [facility] on [address]. Contacted pt and explained to pt [facility] program with a once per year use with $3 copay and [facility] on [address] will fill Rx. Isidoro Donning RN CCM Case Mgmt phone [phone]

## 2018-06-08 NOTE — Telephone Encounter (Signed)
Received call from patient stating he cannot afford Xarelto, he has used the Xarelto 30 day free trial card in the past. He was able to purchase Flexeril for $ 20 (sent pt a goodrx coupon for $9.82 to get back a $10 refund from his pharmacy) Restaurant manager, fast food and spoke to pharmacist. Provided information off Xarelto 30 day free trial card and notification came up stating one benefit in a lifetime. Contacted CM/SW Director for permission of cost override with MATCH. [facility] states cost is $800. Received confirmation from Director, Luci Bank RN with approval for cost override. Will fax MATCH letter to [facility].  Pt states he has utilized [facility] in the past and saw Julianne Handler NP in the clinic. Explained on Monday he will need contact office and arrange follow up appt to complete paperwork with Leane Para to receive medication for free with their patient assistance program. He will need to take all information on income to his appt. Provided pt with clinic contact number.    Isidoro Donning RN CCM Case Mgmt phone [phone]

## 2018-06-09 ENCOUNTER — Other Ambulatory Visit: Payer: Self-pay

## 2018-06-09 ENCOUNTER — Emergency Department (HOSPITAL_COMMUNITY): Payer: Self-pay

## 2018-06-09 ENCOUNTER — Encounter (HOSPITAL_COMMUNITY): Payer: Self-pay | Admitting: Emergency Medicine

## 2018-06-09 ENCOUNTER — Emergency Department (HOSPITAL_COMMUNITY)
Admission: EM | Admit: 2018-06-09 | Discharge: 2018-06-09 | Disposition: A | Discharge status: Home / Self Care | Payer: Self-pay | Attending: Emergency Medicine | Admitting: Emergency Medicine

## 2018-06-09 DIAGNOSIS — R079 Chest pain, unspecified: Secondary | ICD-10-CM

## 2018-06-09 DIAGNOSIS — M546 Pain in thoracic spine: Secondary | ICD-10-CM | POA: Insufficient documentation

## 2018-06-09 DIAGNOSIS — R202 Paresthesia of skin: Secondary | ICD-10-CM | POA: Insufficient documentation

## 2018-06-09 DIAGNOSIS — F17210 Nicotine dependence, cigarettes, uncomplicated: Secondary | ICD-10-CM | POA: Insufficient documentation

## 2018-06-09 DIAGNOSIS — R0602 Shortness of breath: Secondary | ICD-10-CM | POA: Insufficient documentation

## 2018-06-09 DIAGNOSIS — I2699 Other pulmonary embolism without acute cor pulmonale: Secondary | ICD-10-CM | POA: Insufficient documentation

## 2018-06-09 DIAGNOSIS — Z86718 Personal history of other venous thrombosis and embolism: Secondary | ICD-10-CM | POA: Insufficient documentation

## 2018-06-09 LAB — BASIC METABOLIC PANEL
ANION GAP: 6 (ref 5–15)
BUN: 11 mg/dL (ref 6–20)
CALCIUM: 8.5 mg/dL — AB (ref 8.9–10.3)
CO2: 23 mmol/L (ref 22–32)
Chloride: 108 mmol/L (ref 98–111)
Creatinine, Ser: 1.41 mg/dL — ABNORMAL HIGH (ref 0.61–1.24)
GFR calc Af Amer: >60 mL/min (ref >60)
GLUCOSE: 105 mg/dL — AB (ref 70–99)
Potassium: 4.2 mmol/L (ref 3.5–5.1)
Sodium: 137 mmol/L (ref 135–145)

## 2018-06-09 LAB — CBC WITH DIFFERENTIAL/PLATELET
Abs Immature Granulocytes: 0.04 10*3/uL (ref 0.00–0.07)
BASOS PCT: 0 %
Basophils Absolute: 0 10*3/uL (ref 0.0–0.1)
EOS ABS: 0.2 10*3/uL (ref 0.0–0.5)
Eosinophils Relative: 1 %
HEMATOCRIT: 45.7 % (ref 39.0–52.0)
Hemoglobin: 15.5 g/dL (ref 13.0–17.0)
Immature Granulocytes: 0 %
LYMPHS ABS: 1.9 10*3/uL (ref 0.7–4.0)
Lymphocytes Relative: 16 %
MCH: 31.1 pg (ref 26.0–34.0)
MCHC: 33.9 g/dL (ref 30.0–36.0)
MCV: 91.6 fL (ref 80.0–100.0)
MONOS PCT: 10 %
Monocytes Absolute: 1.1 10*3/uL — ABNORMAL HIGH (ref 0.1–1.0)
NEUTROS PCT: 73 %
Neutro Abs: 8.5 10*3/uL — ABNORMAL HIGH (ref 1.7–7.7)
PLATELETS: 249 10*3/uL (ref 150–400)
RBC: 4.99 MIL/uL (ref 4.22–5.81)
RDW: 13.1 % (ref 11.5–15.5)
WBC: 11.8 10*3/uL — ABNORMAL HIGH (ref 4.0–10.5)
nRBC: 0 % (ref 0.0–0.2)

## 2018-06-09 LAB — I-STAT TROPONIN, ED: Troponin i, poc: 0 ng/mL (ref 0.00–0.08)

## 2018-06-09 MED ORDER — ASPIRIN 81 MG PO CHEW
324.0000 mg | CHEWABLE_TABLET | Freq: Once | ORAL | Status: AC
  Administered 2018-06-09: 324 mg via ORAL
  Filled 2018-06-09: qty 4

## 2018-06-09 MED ORDER — MORPHINE SULFATE (PF) 4 MG/ML IV SOLN
4.0000 mg | Freq: Once | INTRAVENOUS | Status: AC
  Administered 2018-06-09: 4 mg via INTRAVENOUS
  Filled 2018-06-09: qty 1

## 2018-06-09 MED ORDER — MORPHINE SULFATE 15 MG PO TABS: 7.5000 mg | ORAL_TABLET | Freq: Three times a day (TID) | ORAL | 0 refills | Status: DC | PRN

## 2018-06-09 MED ORDER — NAPROXEN 500 MG PO TABS: 500.0000 mg | ORAL_TABLET | Freq: Two times a day (BID) | ORAL | 0 refills | Status: DC

## 2018-06-09 NOTE — ED Provider Notes (Signed)
[facility] EMERGENCY DEPARTMENT Provider Note   CSN: [id number] Arrival date & time: 06/09/18  1143    History   Chief Complaint Chief Complaint  Patient presents with   Chest Pain    HPI COLEBY YETT is a 43 y.o. male.     HPI   Pt is a 43 y/o male with a h/o PE, DVT, asthma, anemia who presents to the ED today for evaluation of left upper chest pain (he points along the pectoralis muscle and left axilla). Rates pain 10/10. States pain feels like sharp, stabbing pain. States that at rest the pain "is faint". It is worse with any movement of the LUE and with inspiration. Has some sob as well. No fevers or cough. No recent heavy lifting, falls or trauma. Pt is right handed. States he has some tingling to the first 3 digits on the left hand and to this tip of his tongue. Also has left mid/upper back pain which is also worse with movement of his left arm and with palpation.  Reviewed records per epic, pt was seen in the ED 06/07/18 and diagnosed with bilat lower lobe PE's with developing infarct in the left lower lobe. He was initiated on xarelto at that time. He states he just filled the rx for xarelto yesterday (3/21). He took his first dose last night and second dose this AM. He was also given rx for flexeril during his visit for muscle spasm. He states he has tried taking flexeril at home without relief of his left upper chest wall pain. States when he was here last the morphine completely took his pain away.  Past Medical History:  Diagnosis Date   Anemia    Asthma    as a child     Patient Active Problem List   Diagnosis Date Noted   Pulmonary embolism, bilateral (HCC) 08/09/2016   Acute deep vein thrombosis (DVT) of lower extremity (HCC) 08/09/2016   Pulmonary embolism (HCC) 08/09/2016   Annual physical exam 02/06/2015   Immunization due 02/06/2015    History reviewed. No pertinent surgical history.      Home Medications    Prior to  Admission medications   Medication Sig Start Date End Date Taking? Authorizing Provider  cyclobenzaprine (FLEXERIL) 5 MG tablet Take 1-2 tablets (5-10 mg total) by mouth 3 (three) times daily as needed for up to 7 days for muscle spasms. 06/07/18 06/14/18 Yes Theotis Barrio, MD  Rivaroxaban (XARELTO STARTER PACK) 15 & 20 MG TBPK Take as directed on package: Start with one  tablet by mouth twice a day with food. On Day 22, switch to one  tablet once a day with food. 06/07/18  Yes Theotis Barrio, MD  ibuprofen (ADVIL,MOTRIN) 600 MG tablet Take 1 tablet (600 mg total) by mouth every 6 (six) hours as needed. Patient not taking: Reported on 06/07/2018 09/08/17   Jacalyn Lefevre, MD  morphine (MSIR) 15 MG tablet Take 0.5 tablets (7.5 mg total) by mouth every 8 (eight) hours as needed for severe pain. 06/09/18   Destenee Guerry S, PA-C  naproxen (NAPROSYN) 500 MG tablet Take 1 tablet (500 mg total) by mouth 2 (two) times daily for 5 days. 06/09/18 06/14/18  Mimie Goering S, PA-C  oxyCODONE (ROXICODONE) 15 MG immediate release tablet Take 1 tablet (15 mg total) by mouth every 8 (eight) hours as needed for pain. Patient not taking: Reported on 08/22/2016 08/13/16   Penny Pia, MD  rivaroxaban (XARELTO) 20 MG  TABS tablet Take 1 tablet (20 mg total) by mouth daily with supper. Patient not taking: Reported on 09/08/2017 09/06/16   Massie Maroon, FNP  senna (SENOKOT) 8.6 MG TABS tablet Take 1 tablet (8.6 mg total) by mouth daily. Patient not taking: Reported on 08/22/2016 08/13/16   Penny Pia, MD    Family History Family History  Problem Relation Age of Onset   Diabetes Father    Hypertension Father    Diabetes Brother    Hypertension Brother    Diabetes Paternal Aunt    Hypertension Paternal Aunt    Diabetes Paternal Grandmother     Social History Social History   Tobacco Use   Smoking status: Current Every Day Smoker    Packs/day: 0.25    Types: Cigarettes   Smokeless tobacco:  Never Used  Substance Use Topics   Alcohol use: Yes    Alcohol/week: 15.0 standard drinks    Types: 15 Cans of beer per week   Drug use: No     Allergies   Amoxicillin and Penicillins   Review of Systems Review of Systems  Constitutional: Negative for fever.  HENT: Negative for ear pain and sore throat.   Eyes: Negative for visual disturbance.  Respiratory: Positive for shortness of breath. Negative for cough.   Cardiovascular: Positive for chest pain.  Gastrointestinal: Negative for abdominal pain, nausea and vomiting.  Genitourinary: Negative for dysuria and hematuria.  Musculoskeletal: Positive for back pain.  Skin: Negative for rash.  Neurological: Positive for numbness. Negative for headaches.  All other systems reviewed and are negative.    Physical Exam Updated Vital Signs BP (!) 139/97 (BP Location: Right Arm)    Pulse 76    Temp 98.8 F (37.1 C) (Oral)    Resp (!) 29    Wt 84 kg    SpO2 97%    BMI 29.00 kg/m   Physical Exam Vitals signs and nursing note reviewed.  Constitutional:      Appearance: He is well-developed. He is not ill-appearing or diaphoretic.  HENT:     Head: Normocephalic and atraumatic.  Eyes:     Conjunctiva/sclera: Conjunctivae normal.  Neck:     Musculoskeletal: Neck supple.  Cardiovascular:     Rate and Rhythm: Normal rate and regular rhythm.     Heart sounds: Normal heart sounds. No murmur.     Comments: NSR on monitor Pulmonary:     Effort: Pulmonary effort is normal. No respiratory distress.     Comments: Not tachypneic. Speaking in full sentences. Shallow breaths. Breath sounds are normal. TTP to the left upper chest along the pectoralis muscle which reproduces pain. No rashes. Abdominal:     Palpations: Abdomen is soft.     Tenderness: There is no abdominal tenderness.  Musculoskeletal:     Right lower leg: He exhibits tenderness (right calf). No edema.     Left lower leg: He exhibits no tenderness. No edema.     Comments:  TTP to left mid back which reproduces pain  Skin:    General: Skin is warm and dry.  Neurological:     Mental Status: He is alert.     Comments: Symmetric sensation to the face. No facial droop. Clear speech. Normal coordination of all extremities. Normal strength to BUE and BLE. Abnormal sensation to dorsum of the LUE and to digits 1, 2, 3 on the left hand. Otherwise sensation normal to the remainder of the LUE, RUE, and BLE.  ED Treatments / Results  Labs (all labs ordered are listed, but only abnormal results are displayed) Labs Reviewed  CBC WITH DIFFERENTIAL/PLATELET - Abnormal; Notable for the following components:      Result Value   WBC 11.8 (*)    Neutro Abs 8.5 (*)    Monocytes Absolute 1.1 (*)    All other components within normal limits  BASIC METABOLIC PANEL - Abnormal; Notable for the following components:   Glucose, Bld 105 (*)    Creatinine, Ser 1.41 (*)    Calcium 8.5 (*)    All other components within normal limits  I-STAT TROPONIN, ED    EKG EKG Interpretation  Date/Time:  Sunday June 09 2018 11:54:48 EDT Ventricular Rate:  88 PR Interval:    QRS Duration: 82 QT Interval:  347 QTC Calculation: 420 R Axis:   71 Text Interpretation:  Sinus rhythm Probable left atrial enlargement T wave abnormality Abnormal ekg Confirmed by Gerhard Munch [phone]) on 06/09/2018 12:05:21 PM   Radiology Dg Chest 2 View  Result Date: 06/09/2018 CLINICAL DATA:  Left-sided chest pain which shortness-of-breath. Pulmonary embolism seen on recent CT. EXAM: CHEST - 2 VIEW COMPARISON:  08/09/2016 and chest CT 06/07/2018 FINDINGS: Lungs are somewhat hypoinflated with subtle hazy density over the lateral left base as seen on recent CT likely focal infarction. No significant effusion. Cardiomediastinal silhouette and remainder of the exam is unchanged. IMPRESSION: Subtle hazy density over the lateral left base as seen on recent CT likely infarction. Electronically Signed   By:  Elberta Fortis M.D.   On: 06/09/2018 13:39   Ct Angio Chest Pe W And/or Wo Contrast  Result Date: 06/07/2018 CLINICAL DATA:  Left-sided chest pain, history of PE EXAM: CT ANGIOGRAPHY CHEST WITH CONTRAST TECHNIQUE: Multidetector CT imaging of the chest was performed using the standard protocol during bolus administration of intravenous contrast. Multiplanar CT image reconstructions and MIPs were obtained to evaluate the vascular anatomy. CONTRAST:  26mL OMNIPAQUE IOHEXOL 350 MG/ML SOLN COMPARISON:  09/08/2017 FINDINGS: Cardiovascular: Thoracic aorta shows no significant atherosclerotic changes or aneurysmal dilatation. No cardiac enlargement is seen. Pulmonary artery is well visualized with a normal branching pattern. There are peripheral branches in the lower lobes bilaterally with filling defects consistent with distal pulmonary emboli. No significant upper lobe involvement is noted. Mediastinum/Nodes: Thoracic inlet is within normal limits. No hilar or mediastinal adenopathy is noted. The esophagus as visualized is within normal limits. Lungs/Pleura: Mild emphysematous changes are identified. The lungs are well aerated with the exception of minimal pleural based densities in the left lower lobe most likely representing areas of developing pulmonary infarct. Upper Abdomen: Visualized upper abdomen shows no acute abnormality. Musculoskeletal: Bony structures are within normal limits. Review of the MIP images confirms the above findings. IMPRESSION: Changes consistent with bilateral lower lobe pulmonary emboli. There are changes along the lateral pleural margin of the left lower lobe suspicious for early changes of pulmonary infarct. Emphysema (ICD10-J43.9). Electronically Signed   By: Alcide Clever M.D.   On: 06/07/2018 15:21   Vas [place] Lower Extremity Venous (dvt) (only [facility])  Result Date: 06/09/2018  Lower Venous Study Indications: Pulmonary embolism, and history of DVT and PE.  Performing Technologist:  Gertie Fey MHA, RDMS, RVT, RDCS  Examination Guidelines: A complete evaluation includes B-mode imaging, spectral Doppler, color Doppler, and power Doppler as needed of all accessible portions of each vessel. Bilateral testing is considered an integral part of a complete examination. Limited examinations for reoccurring indications may  be performed as noted.  Right Venous Findings: +---------+---------------+---------+-----------+----------+-------+            Compressibility Phasicity Spontaneity Properties Summary  +---------+---------------+---------+-----------+----------+-------+  CFV       Full            Yes       Yes                             +---------+---------------+---------+-----------+----------+-------+  SFJ       Full                                                      +---------+---------------+---------+-----------+----------+-------+  FV Prox   Full                                                      +---------+---------------+---------+-----------+----------+-------+  FV Mid    Full                                                      +---------+---------------+---------+-----------+----------+-------+  FV Distal Full                                                      +---------+---------------+---------+-----------+----------+-------+  PFV       Full                                                      +---------+---------------+---------+-----------+----------+-------+  POP       Full            Yes       Yes                             +---------+---------------+---------+-----------+----------+-------+  PTV       Full                                                      +---------+---------------+---------+-----------+----------+-------+  PERO      Full                                                      +---------+---------------+---------+-----------+----------+-------+  Left Venous Findings: +---------+---------------+---------+-----------+----------+-------+             Compressibility Phasicity Spontaneity Properties Summary  +---------+---------------+---------+-----------+----------+-------+  CFV  Full            Yes       Yes                             +---------+---------------+---------+-----------+----------+-------+  SFJ       Full                                                      +---------+---------------+---------+-----------+----------+-------+  FV Prox   Full                                                      +---------+---------------+---------+-----------+----------+-------+  FV Mid    Full                                                      +---------+---------------+---------+-----------+----------+-------+  FV Distal Full                                                      +---------+---------------+---------+-----------+----------+-------+  PFV       Full                                                      +---------+---------------+---------+-----------+----------+-------+  POP       Full            Yes       Yes                             +---------+---------------+---------+-----------+----------+-------+  PTV       Full                                                      +---------+---------------+---------+-----------+----------+-------+  PERO      Full                                                      +---------+---------------+---------+-----------+----------+-------+    Summary: Right: There is no evidence of deep vein thrombosis in the lower extremity. No cystic structure found in the popliteal fossa. Left: There is no evidence of deep vein thrombosis in the lower extremity. No cystic structure found in the popliteal fossa.  *See table(s) above for measurements and observations. Electronically signed by Coral Else MD on 06/09/2018 at 7:59:49  AM.    Final     Procedures Procedures (including critical care time)  Medications Ordered in ED Medications  morphine 4 MG/ML injection 4 mg (4 mg Intravenous Given 06/09/18 1240)    aspirin chewable tablet 324 mg (324 mg Oral Given 06/09/18 1240)     Initial Impression / Assessment and Plan / ED Course  I have reviewed the triage vital signs and the nursing notes.  Pertinent labs & imaging results that were available during my care of the patient were reviewed by me and considered in my medical decision making (see chart for details).   [place] narcotic database reviewed and negative for red flags  Final Clinical Impressions(s) / ED Diagnoses   Final diagnoses:  Other pulmonary embolism without acute cor pulmonale, unspecified chronicity (HCC)  Chest pain, unspecified type   Pt here with left upper chest wall pain that is associated with movement of his LUE. CP also pleuritic in nature. He has a known diagnosis of PE and has been started on anticoagulation for this. VSS without tachycardia, hypoxia or increased resp rate. BP WNL.  On exam, lungs CTAB. No respiratory distress or tachypnea. The chest pain described is able to be reproduced on exam with palpation. He has no rashes or evidence of shingles to this area. Cardiac exam without murmurs, RRR.   Given his hx of known PE with possible developing lung infarct, will order labs including trop, EKG, and CXR. Will also give pain meds and reassess.  CBC with mild leukocytosis, no anemia. Thrombocytosis resolved from prior labs 2 days ago. BMP with slightly worsening creatinine from prior  Trop negative  EKG with NSR, Probable left atrial enlargement, T wave abnormality. No ischemic changes.  CT reviewed from prior visit. Pt with changes consistent with bilateral lower lobe pulmonary emboli. There are changes along the lateral pleural margin of the left lower lobe suspicious for early changes of pulmonary infarct  CXR with subtle hazy density over the lateral left base as seen on recent CT likely representing infarction  Pt ambulated with pulse ox and maintained sats at >95% on RA.   Discussed findings with pt. I  think his pain is multifactorial. Suspect pleuritic pain from recent pe and pulmonary infarct, however also having TTP to the left upper chest consistent with muscle spasm. Pain improved after morphine in the ED. Pt has remained stable. Feel he is appropriate for outpt management of sxs. rx given for antiinflammatories and morphine. Advised f/u with pcp for further tx and w/u regarding recurrent VTE. He voices understanding and is in agreement with plan.  ED Discharge Orders         Ordered    morphine (MSIR) 15 MG tablet  Every 8 hours PRN     06/09/18 1418    naproxen (NAPROSYN) 500 MG tablet  2 times daily     06/09/18 [address], [employer], PA-C 06/09/18 1422    Gerhard Munch, MD 06/09/18 [phone]

## 2018-06-09 NOTE — ED Triage Notes (Signed)
Pt here with left sided chest pain from "blood clots" he was diagnosed with a few days ago. Pt states pain has worsened 10/10.

## 2018-06-09 NOTE — ED Notes (Signed)
Patient verbalizes understanding of discharge instructions. Opportunity for questioning and answers were provided. Armband removed by staff, pt discharged from ED.  

## 2018-06-09 NOTE — Discharge Instructions (Signed)
You may alternate taking Tylenol and Naproxen as needed for pain control. You may take Naproxen twice daily as directed on your discharge paperwork and you may take  [phone] mg of Tylenol every 6 hours. Do not exceed 4000 mg of Tylenol daily as this can lead to liver damage. Also, make sure to take Naproxen with meals as it can cause an upset stomach. Do not take other NSAIDs while taking Naproxen such as (Aleve, Ibuprofen, Aspirin, Celebrex, etc) and do not take more than the prescribed dose as this can lead to ulcers and bleeding in your GI tract. You may use warm and cold compresses to help with your symptoms.   Prescription given for Morphine. Take medication as directed and do not operate machinery, drive a car, or work while taking this medication as it can make you drowsy.   Please follow up with your primary doctor within the next 7-10 days for re-evaluation and further treatment of your symptoms.   Please return to the ER sooner if you have any new or worsening symptoms.

## 2018-06-10 ENCOUNTER — Observation Stay (HOSPITAL_COMMUNITY)
Admission: EM | Admit: 2018-06-10 | Discharge: 2018-06-11 | Disposition: A | Discharge status: Home / Self Care | Payer: Self-pay | Attending: Internal Medicine | Admitting: Internal Medicine

## 2018-06-10 ENCOUNTER — Observation Stay (HOSPITAL_BASED_OUTPATIENT_CLINIC_OR_DEPARTMENT_OTHER): Payer: Self-pay

## 2018-06-10 ENCOUNTER — Other Ambulatory Visit: Payer: Self-pay

## 2018-06-10 ENCOUNTER — Encounter (HOSPITAL_COMMUNITY): Payer: Self-pay | Admitting: Emergency Medicine

## 2018-06-10 ENCOUNTER — Emergency Department (HOSPITAL_COMMUNITY): Payer: Self-pay

## 2018-06-10 DIAGNOSIS — R079 Chest pain, unspecified: Secondary | ICD-10-CM | POA: Diagnosis present

## 2018-06-10 DIAGNOSIS — I2699 Other pulmonary embolism without acute cor pulmonale: Secondary | ICD-10-CM | POA: Diagnosis present

## 2018-06-10 DIAGNOSIS — M7918 Myalgia, other site: Secondary | ICD-10-CM | POA: Insufficient documentation

## 2018-06-10 DIAGNOSIS — R091 Pleurisy: Principal | ICD-10-CM | POA: Insufficient documentation

## 2018-06-10 DIAGNOSIS — Z86711 Personal history of pulmonary embolism: Secondary | ICD-10-CM | POA: Insufficient documentation

## 2018-06-10 DIAGNOSIS — F17210 Nicotine dependence, cigarettes, uncomplicated: Secondary | ICD-10-CM | POA: Insufficient documentation

## 2018-06-10 DIAGNOSIS — Z79899 Other long term (current) drug therapy: Secondary | ICD-10-CM | POA: Insufficient documentation

## 2018-06-10 DIAGNOSIS — R0600 Dyspnea, unspecified: Secondary | ICD-10-CM | POA: Insufficient documentation

## 2018-06-10 DIAGNOSIS — Z7901 Long term (current) use of anticoagulants: Secondary | ICD-10-CM | POA: Insufficient documentation

## 2018-06-10 DIAGNOSIS — Z86718 Personal history of other venous thrombosis and embolism: Secondary | ICD-10-CM | POA: Insufficient documentation

## 2018-06-10 HISTORY — DX: Acute embolism and thrombosis of unspecified vein: I82.90

## 2018-06-10 HISTORY — DX: Other pulmonary embolism without acute cor pulmonale: I26.99

## 2018-06-10 HISTORY — DX: Acute embolism and thrombosis of unspecified deep veins of unspecified lower extremity: I82.409

## 2018-06-10 LAB — CBC
HCT: 46.6 % (ref 39.0–52.0)
HCT: 37.3 % — ABNORMAL LOW (ref 39.0–52.0)
Hemoglobin: 15.6 g/dL (ref 13.0–17.0)
Hemoglobin: 12.2 g/dL — ABNORMAL LOW (ref 13.0–17.0)
MCH: 31.6 pg (ref 26.0–34.0)
MCH: 31.5 pg (ref 26.0–34.0)
MCHC: 33.5 g/dL (ref 30.0–36.0)
MCHC: 32.7 g/dL (ref 30.0–36.0)
MCV: 94.3 fL (ref 80.0–100.0)
MCV: 96.4 fL (ref 80.0–100.0)
PLATELETS: 255 10*3/uL (ref 150–400)
PLATELETS: 193 10*3/uL (ref 150–400)
RBC: 4.94 MIL/uL (ref 4.22–5.81)
RBC: 3.87 MIL/uL — ABNORMAL LOW (ref 4.22–5.81)
RDW: 13 % (ref 11.5–15.5)
RDW: 13.2 % (ref 11.5–15.5)
WBC: 10.9 10*3/uL — AB (ref 4.0–10.5)
WBC: 8.7 10*3/uL (ref 4.0–10.5)
nRBC: 0 % (ref 0.0–0.2)
nRBC: 0 % (ref 0.0–0.2)

## 2018-06-10 LAB — ECHOCARDIOGRAM COMPLETE
Height: 67 in
Weight: 2962.98 oz

## 2018-06-10 LAB — BASIC METABOLIC PANEL
Anion gap: 8 (ref 5–15)
BUN: 13 mg/dL (ref 6–20)
CALCIUM: 8.5 mg/dL — AB (ref 8.9–10.3)
CO2: 24 mmol/L (ref 22–32)
Chloride: 104 mmol/L (ref 98–111)
Creatinine, Ser: 1.16 mg/dL (ref 0.61–1.24)
GFR calc Af Amer: >60 mL/min (ref >60)
GFR calc non Af Amer: >60 mL/min (ref >60)
Glucose, Bld: 109 mg/dL — ABNORMAL HIGH (ref 70–99)
Potassium: 4.2 mmol/L (ref 3.5–5.1)
Sodium: 136 mmol/L (ref 135–145)

## 2018-06-10 LAB — BASIC METABOLIC PANEL WITH GFR
Anion gap: 10 (ref 5–15)
BUN: 13 mg/dL (ref 6–20)
CO2: 22 mmol/L (ref 22–32)
Calcium: 8.8 mg/dL — ABNORMAL LOW (ref 8.9–10.3)
Chloride: 104 mmol/L (ref 98–111)
Creatinine, Ser: 1.22 mg/dL (ref 0.61–1.24)
GFR calc Af Amer: >60 mL/min
GFR calc non Af Amer: >60 mL/min
Glucose, Bld: 133 mg/dL — ABNORMAL HIGH (ref 70–99)
Potassium: 3.9 mmol/L (ref 3.5–5.1)
Sodium: 136 mmol/L (ref 135–145)

## 2018-06-10 LAB — I-STAT TROPONIN, ED: Troponin i, poc: 0.03 ng/mL (ref 0.00–0.08)

## 2018-06-10 LAB — HIV ANTIBODY (ROUTINE TESTING W REFLEX): HIV Screen 4th Generation wRfx: NONREACTIVE

## 2018-06-10 LAB — TROPONIN I: Troponin I: <0.03 ng/mL (ref <0.03)

## 2018-06-10 MED ORDER — SODIUM CHLORIDE 0.9% FLUSH
3.0000 mL | Freq: Once | INTRAVENOUS | Status: AC
  Administered 2018-06-10: 3 mL via INTRAVENOUS

## 2018-06-10 MED ORDER — ONDANSETRON HCL 4 MG/2ML IJ SOLN: 4.0000 mg | Freq: Four times a day (QID) | INTRAMUSCULAR | Status: DC | PRN

## 2018-06-10 MED ORDER — LIDOCAINE 5 % EX PTCH
1.0000 | MEDICATED_PATCH | CUTANEOUS | Status: DC
  Administered 2018-06-10 – 2018-06-11 (×2): 1 via TRANSDERMAL
  Filled 2018-06-10 (×3): qty 1

## 2018-06-10 MED ORDER — OXYCODONE HCL 5 MG PO TABS: 5.0000 mg | ORAL_TABLET | ORAL | Status: DC | PRN

## 2018-06-10 MED ORDER — POLYETHYLENE GLYCOL 3350 17 G PO PACK
17.0000 g | PACK | Freq: Two times a day (BID) | ORAL | Status: DC
  Administered 2018-06-10 – 2018-06-11 (×3): 17 g via ORAL
  Filled 2018-06-10 (×3): qty 1

## 2018-06-10 MED ORDER — ACETAMINOPHEN 650 MG RE SUPP: 650.0000 mg | Freq: Four times a day (QID) | RECTAL | Status: DC | PRN

## 2018-06-10 MED ORDER — OXYCODONE HCL 5 MG PO TABS: 10.0000 mg | ORAL_TABLET | ORAL | Status: DC | PRN

## 2018-06-10 MED ORDER — MORPHINE SULFATE (PF) 2 MG/ML IV SOLN
2.0000 mg | INTRAVENOUS | Status: DC | PRN
  Administered 2018-06-10 – 2018-06-11 (×6): 2 mg via INTRAVENOUS
  Filled 2018-06-10 (×6): qty 1

## 2018-06-10 MED ORDER — RIVAROXABAN 15 MG PO TABS
15.0000 mg | ORAL_TABLET | Freq: Two times a day (BID) | ORAL | Status: DC
  Administered 2018-06-10 – 2018-06-11 (×2): 15 mg via ORAL
  Filled 2018-06-10 (×2): qty 1

## 2018-06-10 MED ORDER — RIVAROXABAN 15 MG PO TABS
15.0000 mg | ORAL_TABLET | ORAL | Status: AC
  Administered 2018-06-10: 15 mg via ORAL
  Filled 2018-06-10: qty 1

## 2018-06-10 MED ORDER — ACETAMINOPHEN 325 MG PO TABS: 650.0000 mg | ORAL_TABLET | Freq: Four times a day (QID) | ORAL | Status: DC | PRN

## 2018-06-10 MED ORDER — SODIUM CHLORIDE 0.9 % IV SOLN
INTRAVENOUS | Status: AC
  Administered 2018-06-10: 04:00:00 via INTRAVENOUS

## 2018-06-10 MED ORDER — KETOROLAC TROMETHAMINE 30 MG/ML IJ SOLN
30.0000 mg | Freq: Four times a day (QID) | INTRAMUSCULAR | Status: DC | PRN
  Administered 2018-06-10 – 2018-06-11 (×2): 30 mg via INTRAVENOUS
  Filled 2018-06-10 (×2): qty 1

## 2018-06-10 MED ORDER — MORPHINE SULFATE (PF) 4 MG/ML IV SOLN
6.0000 mg | Freq: Once | INTRAVENOUS | Status: AC
  Administered 2018-06-10: 6 mg via INTRAVENOUS
  Filled 2018-06-10: qty 2

## 2018-06-10 MED ORDER — ONDANSETRON HCL 4 MG PO TABS: 4.0000 mg | ORAL_TABLET | Freq: Four times a day (QID) | ORAL | Status: DC | PRN

## 2018-06-10 MED ORDER — SENNOSIDES-DOCUSATE SODIUM 8.6-50 MG PO TABS
2.0000 | ORAL_TABLET | Freq: Two times a day (BID) | ORAL | Status: DC
  Administered 2018-06-10 – 2018-06-11 (×3): 2 via ORAL
  Filled 2018-06-10 (×3): qty 2

## 2018-06-10 NOTE — Progress Notes (Signed)
Pt alert and oriented x3, VSS, resp even and unlabored, skin pink/warm/dry, in NAD. Pt had uneventful shift, tolerated PO intake well, pain better controlled with PRN Morphine given with improvement in BP.

## 2018-06-10 NOTE — ED Provider Notes (Signed)
[facility]-EMERGENCY DEPT Provider Note   CSN: [id number] Arrival date & time: 06/10/18  0036    History   Chief Complaint Chief Complaint  Patient presents with  . Chest Pain  . Shortness of Breath    HPI Jeremiah [name] is a 43 y.o. male.     Patient with history of PE (08/09/16 CTA showing bilateral lower lobe PE's with left lower lobe infarct). He reports he was anticoagulated for one year which was discontinued by his doctor. Review of chart shows CTA 08/2017 with no PE's. He returns to ED tonight for 3rd visit since 06/07/18 when he presented with right LE calf pain, and left upper chest pain with SOB. He was diagnosed with bilateral lower lobe PE's at that time (negative LE doppler) and he started Xarelto on 06/08/18. He states he has been compliant since that time. He returned 06/08/18 with complaint of uncontrolled left upper chest pain and SOB. On completion of his re-evaluation, the patient was found appropriate for discharge home with Rx Morphine. He returned tonight with continued pain and SOB, uncontrolled at home with this medication. He continues to have right calf pain that was felt to be muscular in origin. No fever.    The history is provided by the patient. No language interpreter was used.  Chest Pain  Associated symptoms: shortness of breath   Associated symptoms: no abdominal pain, no fever, no nausea, no vomiting and no weakness   Shortness of Breath  Associated symptoms: chest pain   Associated symptoms: no abdominal pain, no fever and no vomiting     Past Medical History:  Diagnosis Date  . Anemia   . Asthma    as a child   . Blood clot in vein   . DVT (deep venous thrombosis) (HCC)   . Pulmonary embolism [facility])     Patient Active Problem List   Diagnosis Date Noted  . Pulmonary embolism, bilateral (HCC) 08/09/2016  . Acute deep vein thrombosis (DVT) of lower extremity (HCC) 08/09/2016  . Pulmonary embolism (HCC) 08/09/2016  .  Annual physical exam 02/06/2015  . Immunization due 02/06/2015    History reviewed. No pertinent surgical history.      Home Medications    Prior to Admission medications   Medication Sig Start Date End Date Taking? Authorizing Provider  cyclobenzaprine (FLEXERIL) 5 MG tablet Take 1-2 tablets (5-10 mg total) by mouth 3 (three) times daily as needed for up to 7 days for muscle spasms. 06/07/18 06/14/18  Theotis Barrio, MD  ibuprofen (ADVIL,MOTRIN) 600 MG tablet Take 1 tablet (600 mg total) by mouth every 6 (six) hours as needed. Patient not taking: Reported on 06/07/2018 09/08/17   Jacalyn Lefevre, MD  morphine (MSIR) 15 MG tablet Take 0.5 tablets (7.5 mg total) by mouth every 8 (eight) hours as needed for severe pain. 06/09/18   Couture, Cortni S, PA-C  naproxen (NAPROSYN) 500 MG tablet Take 1 tablet (500 mg total) by mouth 2 (two) times daily for 5 days. 06/09/18 06/14/18  Couture, Cortni S, PA-C  oxyCODONE (ROXICODONE) 15 MG immediate release tablet Take 1 tablet (15 mg total) by mouth every 8 (eight) hours as needed for pain. Patient not taking: Reported on 08/22/2016 08/13/16   Penny Pia, MD  Rivaroxaban (XARELTO STARTER PACK) 15 & 20 MG TBPK Take as directed on package: Start with one  tablet by mouth twice a day with food. On Day 22, switch to one  tablet once a day  with food. 06/07/18   Theotis Barrio, MD  rivaroxaban (XARELTO) 20 MG TABS tablet Take 1 tablet (20 mg total) by mouth daily with supper. Patient not taking: Reported on 09/08/2017 09/06/16   Massie Maroon, FNP  senna (SENOKOT) 8.6 MG TABS tablet Take 1 tablet (8.6 mg total) by mouth daily. Patient not taking: Reported on 08/22/2016 08/13/16   Penny Pia, MD    Family History Family History  Problem Relation Age of Onset  . Diabetes Father   . Hypertension Father   . Diabetes Brother   . Hypertension Brother   . Diabetes Paternal Aunt   . Hypertension Paternal Aunt   . Diabetes Paternal Grandmother     Social  History Social History   Tobacco Use  . Smoking status: Current Every Day Smoker    Packs/day: 0.25    Types: Cigarettes  . Smokeless tobacco: Never Used  Substance Use Topics  . Alcohol use: Yes    Alcohol/week: 15.0 standard drinks    Types: 15 Cans of beer per week  . Drug use: No     Allergies   Amoxicillin and Penicillins   Review of Systems Review of Systems  Constitutional: Negative for fever.  HENT: Negative.   Respiratory: Positive for shortness of breath.   Cardiovascular: Positive for chest pain.  Gastrointestinal: Negative for abdominal pain, nausea and vomiting.  Musculoskeletal:       See HPI.  Skin: Negative for color change.  Neurological: Negative for syncope and weakness.     Physical Exam Updated Vital Signs BP (!) 152/98   Pulse 91   Temp 99.4 F (37.4 C) (Oral)   Resp 19   Ht 5\' 7"  (1.702 m)   Wt 84 kg   SpO2 97%   BMI 29.00 kg/m   Physical Exam Vitals signs and nursing note reviewed.  Constitutional:      Appearance: He is well-developed. He is not diaphoretic.     Comments: Uncomfortable in appearance.  Neck:     Musculoskeletal: Normal range of motion.  Cardiovascular:     Rate and Rhythm: Regular rhythm. Tachycardia present.     Heart sounds: No murmur.  Pulmonary:     Effort: Pulmonary effort is normal. Tachypnea present.     Breath sounds: No wheezing, rhonchi or rales.     Comments: Poor effort limiting exam.   He is tender over left upper chest, lateral chest wall and left upper back.  Musculoskeletal:     Comments: Right posterior calf pain without redness or swelling.  Skin:    General: Skin is warm and dry.     Findings: No erythema.  Neurological:     Mental Status: He is alert.      ED Treatments / Results  Labs (all labs ordered are listed, but only abnormal results are displayed) Labs Reviewed  CBC - Abnormal; Notable for the following components:      Result Value   WBC 10.9 (*)    All other  components within normal limits  BASIC METABOLIC PANEL  I-STAT TROPONIN, ED   Results for orders placed or performed during the hospital encounter of 06/10/18  Basic metabolic panel  Result Value Ref Range   Sodium 136 135 - 145 mmol/L   Potassium 3.9 3.5 - 5.1 mmol/L   Chloride 104 98 - 111 mmol/L   CO2 22 22 - 32 mmol/L   Glucose, Bld 133 (H) 70 - 99 mg/dL   BUN 13 6 -  20 mg/dL   Creatinine, Ser 7.25 0.61 - 1.24 mg/dL   Calcium 8.8 (L) 8.9 - 10.3 mg/dL   GFR calc non Af Amer >60 >60 mL/min   GFR calc Af Amer >60 >60 mL/min   Anion gap 10 5 - 15  CBC  Result Value Ref Range   WBC 10.9 (H) 4.0 - 10.5 K/uL   RBC 4.94 4.22 - 5.81 MIL/uL   Hemoglobin 15.6 13.0 - 17.0 g/dL   HCT 36.6 44.0 - 34.7 %   MCV 94.3 80.0 - 100.0 fL   MCH 31.6 26.0 - 34.0 pg   MCHC 33.5 30.0 - 36.0 g/dL   RDW 42.5 95.6 - 38.7 %   Platelets 255 150 - 400 K/uL   nRBC 0.0 0.0 - 0.2 %  I-stat troponin, ED  Result Value Ref Range   Troponin i, poc 0.03 0.00 - 0.08 ng/mL   Comment 3            EKG None  Radiology Dg Chest 2 View  Result Date: 06/10/2018 CLINICAL DATA:  43 year old male with chest pain and shortness of breath. Bilateral pulmonary emboli diagnosed by CTA on 06/07/2018. EXAM: CHEST - 2 VIEW COMPARISON:  Radiographs 06/09/2018.  CTA 06/07/2018. FINDINGS: Wedge-shaped and peripheral left lower lung opacity redemonstrated and highly suggestive of pulmonary infarct in this clinical setting. Continued low lung volumes. Additional streaky retrocardiac opacity. No pneumothorax, pulmonary edema or pleural effusion. Normal cardiac size and mediastinal contours. Visualized tracheal air column is within normal limits. Negative visible bowel gas pattern. No acute osseous abnormality identified. IMPRESSION: 1. Left lower lung pulmonary infarct.  No pleural effusion. 2. Low lung volumes, such that additional streaky lung base opacity may be atelectasis. Electronically Signed   By: Odessa Fleming M.D.   On: 06/10/2018  01:37   Dg Chest 2 View  Result Date: 06/09/2018 CLINICAL DATA:  Left-sided chest pain which shortness-of-breath. Pulmonary embolism seen on recent CT. EXAM: CHEST - 2 VIEW COMPARISON:  08/09/2016 and chest CT 06/07/2018 FINDINGS: Lungs are somewhat hypoinflated with subtle hazy density over the lateral left base as seen on recent CT likely focal infarction. No significant effusion. Cardiomediastinal silhouette and remainder of the exam is unchanged. IMPRESSION: Subtle hazy density over the lateral left base as seen on recent CT likely infarction. Electronically Signed   By: Elberta Fortis M.D.   On: 06/09/2018 13:39    Procedures Procedures (including critical care time)  Medications Ordered in ED Medications  sodium chloride flush (NS) 0.9 % injection 3 mL (3 mLs Intravenous Given 06/10/18 0136)     Initial Impression / Assessment and Plan / ED Course  I have reviewed the triage vital signs and the nursing notes.  Pertinent labs & imaging results that were available during my care of the patient were reviewed by me and considered in my medical decision making (see chart for details).        Patient returns to ED for 3rd visit since being diagnosed with recurrent bilateral lower lobe PE's with left lower lobe infarct with complaint of uncontrolled, severe left chest pain and SOB. No fever.   Chart reviewed. EKG tonight is non-ischemic. Normal troponin. Doubt ACS as cause of pain. Patient is tachycardic on exam (low 100's) and EKG, settles in the 90's. Remains tachypneic. Suboptimal O2 sat at 94%.  Given multiple returns to ED with uncontrolled pain in the setting of diagnosed bilateral PE's with left lower infarct, the patient will be admitted for further  management.   Final Clinical Impressions(s) / ED Diagnoses   Final diagnoses:  None   1. Subacute bilateral PE 2. Chest pain 3. Dyspnea  ED Discharge Orders    None       Elpidio Anis, PA-C 06/10/18 0217     Derwood Kaplan, MD 06/10/18 2725    Derwood Kaplan, MD 06/10/18 3664

## 2018-06-10 NOTE — Progress Notes (Signed)
Echocardiogram 2D Echocardiogram has been performed.  Pieter Partridge 06/10/2018, 9:05 AM

## 2018-06-10 NOTE — Progress Notes (Signed)
ANTICOAGULATION CONSULT NOTE - Initial Consult  Pharmacy Consult for Rivaroxaban Indication: pulmonary embolus  Allergies  Allergen Reactions  . Amoxicillin Anaphylaxis    Did it involve swelling of the face/tongue/throat, SOB, or low BP? Yes Did it involve sudden or severe rash/hives, skin peeling, or any reaction on the inside of your mouth or nose? Yes Did you need to seek medical attention at a hospital or doctor's office? Yes When did it last happen?43 YEARS OLD If all above answers are "NO", may proceed with cephalosporin use.   Marland Kitchen Penicillins Anaphylaxis    Did it involve swelling of the face/tongue/throat, SOB, or low BP? Yes Did it involve sudden or severe rash/hives, skin peeling, or any reaction on the inside of your mouth or nose? Yes Did you need to seek medical attention at a hospital or doctor's office? Yes When did it last happen?CHILDHOOD If all above answers are "NO", may proceed with cephalosporin use.     Patient Measurements: Height: 5\' 7"  (170.2 cm) Weight: 185 lb 3 oz (84 kg) IBW/kg (Calculated) : 66.1 Heparin Dosing Weight:   Vital Signs: Temp: 98.9 F (37.2 C) (03/23 0330) Temp Source: Oral (03/23 0051) BP: 139/99 (03/23 0400) Pulse Rate: 77 (03/23 0400)  Labs: Recent Labs    06/07/18 1258 06/09/18 1218 06/10/18 0132  HGB  --  15.5 15.6  HCT  --  45.7 46.6  PLT  --  249 255  CREATININE 1.30* 1.41* 1.22    Estimated Creatinine Clearance: 80.9 mL/min (by C-G formula based on SCr of 1.22 mg/dL).   Medical History: Past Medical History:  Diagnosis Date  . Anemia   . Asthma    as a child   . Blood clot in vein   . DVT (deep venous thrombosis) (HCC)   . Pulmonary embolism (HCC)     Medications:  Scheduled:  . rivaroxaban  15 mg Oral STAT    Assessment: Pharmacy is consulted to continue Xarelto in 43 yo male with diagnosed with bilateral lower lobe PE's  (negative LE doppler) and was started on Xarelto on 06/08/18. He  states he has been compliant since that time. He returned to the ED  06/08/18 with complaint of uncontrolled left upper chest pain and SOB. On completion of his re-evaluation, the patient was found appropriate for discharge home with Rx Morphine. He returned to the ED again on 3/22 with continued pain and SOB, uncontrolled at home with this medication.     Today, 06/10/18  Hgb 12.2, plt 193  Scr 1.22 mg/dl, CrCl 81 ml/min      Goal of Therapy:  Monitor platelets by anticoagulation protocol: Yes   Plan:   Continue Xarelto 15 mg PO BID x 21 days, then Xarelto 20 mg PO daily.  Monitor for signs and symptoms of bleeding   Adalberto Cole, PharmD, BCPS Pager [phone] 06/10/2018 4:35 AM

## 2018-06-10 NOTE — H&P (Signed)
History and Physical    Jeremiah [name] DZH:[id number] DOB: [birth year] DOA: 06/10/2018  PCP: Massie Maroon, FNP  Patient coming from: Home.  Chief Complaint: Chest pain.  HPI: Jeremiah [name] is a 43 y.o. male with history of recently diagnosed pulmonary embolism on June 07, 2018 presents to the ER for the third time in the last 4 days with complaint of chest pain.  Patient states his chest pain has been increasing.  Pain mostly on deep inspiration but denies any fever chills productive cough.  Chest pain makes him short of breath.  In the ER on June 07, 2018 patient had CAT scan showing bilateral pulmonary embolism and infarct.  At the time Dopplers of the lower extremity was negative for DVT.  Still complains of mild pain in the right calf.  Patient states she has been compliant with Xarelto which was given and also morphine which was given during recent visit to the ER.  ED Course: Chest x-ray shows infarcts of the lung.  EKG shows normal sinus rhythm.  Since patient has been a significant and intolerable pain likely from problem I will see him admitted for further pain control and management.  Review of Systems: As per HPI, rest all negative.   Past Medical History:  Diagnosis Date   Anemia    Asthma    as a child    Blood clot in vein    DVT (deep venous thrombosis) (HCC)    Pulmonary embolism (HCC)     History reviewed. No pertinent surgical history.   reports that he has been smoking cigarettes. He has been smoking about 0.25 packs per day. He has never used smokeless tobacco. He reports current alcohol use of about 15.0 standard drinks of alcohol per week. He reports that he does not use drugs.  Allergies  Allergen Reactions   Amoxicillin Anaphylaxis    Did it involve swelling of the face/tongue/throat, SOB, or low BP? Yes Did it involve sudden or severe rash/hives, skin peeling, or any reaction on the inside of your mouth or nose? Yes Did you need to seek medical  attention at a hospital or doctor's office? Yes When did it last happen?43 YEARS OLD If all above answers are NO, may proceed with cephalosporin use.    Penicillins Anaphylaxis    Did it involve swelling of the face/tongue/throat, SOB, or low BP? Yes Did it involve sudden or severe rash/hives, skin peeling, or any reaction on the inside of your mouth or nose? Yes Did you need to seek medical attention at a hospital or doctor's office? Yes When did it last happen?CHILDHOOD If all above answers are NO, may proceed with cephalosporin use.     Family History  Problem Relation Age of Onset   Diabetes Father    Hypertension Father    Diabetes Brother    Hypertension Brother    Diabetes Paternal Aunt    Hypertension Paternal Aunt    Diabetes Paternal Grandmother     Prior to Admission medications   Medication Sig Start Date End Date Taking? Authorizing Provider  cyclobenzaprine (FLEXERIL) 5 MG tablet Take 1-2 tablets (5-10 mg total) by mouth 3 (three) times daily as needed for up to 7 days for muscle spasms. 06/07/18 06/14/18 Yes Theotis Barrio, MD  morphine (MSIR) 15 MG tablet Take 0.5 tablets (7.5 mg total) by mouth every 8 (eight) hours as needed for severe pain. 06/09/18  Yes Couture, Cortni S, PA-C  naproxen (NAPROSYN) 500 MG  tablet Take 1 tablet (500 mg total) by mouth 2 (two) times daily for 5 days. 06/09/18 06/14/18 Yes Couture, Cortni S, PA-C  Rivaroxaban (XARELTO STARTER PACK) 15 & 20 MG TBPK Take as directed on package: Start with one  tablet by mouth twice a day with food. On Day 22, switch to one  tablet once a day with food. 06/07/18  Yes Theotis Barrio, MD  ibuprofen (ADVIL,MOTRIN) 600 MG tablet Take 1 tablet (600 mg total) by mouth every 6 (six) hours as needed. Patient not taking: Reported on 06/07/2018 09/08/17   Jacalyn Lefevre, MD  oxyCODONE (ROXICODONE) 15 MG immediate release tablet Take 1 tablet (15 mg total) by mouth every 8 (eight) hours as  needed for pain. Patient not taking: Reported on 08/22/2016 08/13/16   Penny Pia, MD    Physical Exam: Vitals:   06/10/18 0254 06/10/18 0300 06/10/18 0330 06/10/18 0343  BP:  (!) 144/93 (!) 138/99   Pulse: 84 88 74 78  Resp: Temp:   98.9 F (37.2 C)   TempSrc:      SpO2: 96% 96% 98% 96%  Weight:      Height:          Constitutional: Moderately built and nourished. Vitals:   06/10/18 0254 06/10/18 0300 06/10/18 0330 06/10/18 0343  BP:  (!) 144/93 (!) 138/99   Pulse: 84 88 74 78  Resp: Temp:   98.9 F (37.2 C)   TempSrc:      SpO2: 96% 96% 98% 96%  Weight:      Height:       Eyes: Anicteric no pallor. ENMT: No discharge from the ears eyes nose and mouth. Neck: No mass felt.  No neck rigidity. Respiratory: No rhonchi or crepitations. Cardiovascular: S1-S2 heard. Abdomen: Soft nontender bowel sounds present. Musculoskeletal: No edema.  No joint effusion. Skin: No rash. Neurologic: Alert awake oriented to time place and person.  Moves all extremities. Psychiatric: Appears normal per normal affect.   Labs on Admission: I have personally reviewed following labs and imaging studies  CBC: Recent Labs  Lab 06/09/18 1218 06/10/18 0132  WBC 11.8* 10.9*  NEUTROABS 8.5*  --   HGB 15.5 15.6  HCT 45.7 46.6  MCV 91.6 94.3  PLT 249 255   Basic Metabolic Panel: Recent Labs  Lab 06/07/18 1258 06/09/18 1218 06/10/18 0132  NA 139 137 136  K 4.1 4.2 3.9  CL 107 108 104  CO2 GLUCOSE 88 105* 133*  BUN CREATININE 1.30* 1.41* 1.22  CALCIUM 9.1 8.5* 8.8*   GFR: Estimated Creatinine Clearance: 80.9 mL/min (by C-G formula based on SCr of 1.22 mg/dL). Liver Function Tests: No results for input(s): AST, ALT, ALKPHOS, BILITOT, PROT, ALBUMIN in the last 168 hours. No results for input(s): LIPASE, AMYLASE in the last 168 hours. No results for input(s): AMMONIA in the last 168 hours. Coagulation Profile: No results for  input(s): INR, PROTIME in the last 168 hours. Cardiac Enzymes: No results for input(s): CKTOTAL, CKMB, CKMBINDEX, TROPONINI in the last 168 hours. BNP (last 3 results) No results for input(s): PROBNP in the last 8760 hours. HbA1C: No results for input(s): HGBA1C in the last 72 hours. CBG: No results for input(s): GLUCAP in the last 168 hours. Lipid Profile: No results for input(s): CHOL, HDL, LDLCALC, TRIG, CHOLHDL, LDLDIRECT in the last 72 hours. Thyroid Function Tests: No results for input(s): TSH,  T4TOTAL, FREET4, T3FREE, THYROIDAB in the last 72 hours. Anemia Panel: No results for input(s): VITAMINB12, FOLATE, FERRITIN, TIBC, IRON, RETICCTPCT in the last 72 hours. Urine analysis:    Component Value Date/Time   COLORURINE YELLOW 01/02/2015 0932   APPEARANCEUR CLEAR 01/02/2015 0932   LABSPEC 1.020 09/05/2016 1549   PHURINE 5.5 09/05/2016 1549   GLUCOSEU NEGATIVE 09/05/2016 1549   HGBUR TRACE (A) 09/05/2016 1549   BILIRUBINUR NEGATIVE 09/05/2016 1549   KETONESUR NEGATIVE 09/05/2016 1549   PROTEINUR NEGATIVE 09/05/2016 1549   UROBILINOGEN 0.2 09/05/2016 1549   NITRITE NEGATIVE 09/05/2016 1549   LEUKOCYTESUR NEGATIVE 09/05/2016 1549   Sepsis Labs: @LABRCNTIP (procalcitonin:4,lacticidven:4) )No results found for this or any previous visit (from the past 240 hour(s)).   Radiological Exams on Admission: Dg Chest 2 View  Result Date: 06/10/2018 CLINICAL DATA:  43 year old male with chest pain and shortness of breath. Bilateral pulmonary emboli diagnosed by CTA on 06/07/2018. EXAM: CHEST - 2 VIEW COMPARISON:  Radiographs 06/09/2018.  CTA 06/07/2018. FINDINGS: Wedge-shaped and peripheral left lower lung opacity redemonstrated and highly suggestive of pulmonary infarct in this clinical setting. Continued low lung volumes. Additional streaky retrocardiac opacity. No pneumothorax, pulmonary edema or pleural effusion. Normal cardiac size and mediastinal contours. Visualized tracheal air  column is within normal limits. Negative visible bowel gas pattern. No acute osseous abnormality identified. IMPRESSION: 1. Left lower lung pulmonary infarct.  No pleural effusion. 2. Low lung volumes, such that additional streaky lung base opacity may be atelectasis. Electronically Signed   By: Odessa Fleming M.D.   On: 06/10/2018 01:37   Dg Chest 2 View  Result Date: 06/09/2018 CLINICAL DATA:  Left-sided chest pain which shortness-of-breath. Pulmonary embolism seen on recent CT. EXAM: CHEST - 2 VIEW COMPARISON:  08/09/2016 and chest CT 06/07/2018 FINDINGS: Lungs are somewhat hypoinflated with subtle hazy density over the lateral left base as seen on recent CT likely focal infarction. No significant effusion. Cardiomediastinal silhouette and remainder of the exam is unchanged. IMPRESSION: Subtle hazy density over the lateral left base as seen on recent CT likely infarction. Electronically Signed   By: Elberta Fortis M.D.   On: 06/09/2018 13:39    EKG: Independently reviewed.  Normal sinus rhythm.  Assessment/Plan Principal Problem:   Chest pain Active Problems:   Pulmonary embolism, bilateral (HCC)    1. Chest pain appears pleuritic likely from pulmonary embolism with infarct.  I have placed patient on morphine IV pain medications.  Closely observe.  Troponin was negative. 2. Pulmonary embolism bilateral hemodynamically stable unprovoked.  Has had previous episode of pulmonary embolism in May 2018.  Since this is a second episode patient likely needs to be on lifelong anticoagulation. 3. Tobacco abuse advised to quit smoking.   DVT prophylaxis: Xarelto. Code Status: Full code. Family Communication: Discussed with patient. Disposition Plan: Home. Consults called: None. Admission status: Observation.   Eduard Clos MD [facility] Pager [phone].  If 7PM-7AM, please contact night-coverage [email or web address] Password [email or web address]  06/10/2018, 3:52 AM

## 2018-06-10 NOTE — Progress Notes (Signed)
Nutrition Brief Note  Patient identified on the Malnutrition Screening Tool (MST) Report  Wt Readings from Last 15 Encounters:  06/10/18 84 kg  06/09/18 84 kg  06/07/18 84.8 kg  09/08/17 83.9 kg  09/05/16 82.1 kg  08/22/16 82.6 kg  08/09/16 82.6 kg  02/05/15 81.2 kg    Patient with PMH significant for anemia, DVT, and PE. Presents this admission with chest pain likely pleuritic from PE with infarct. Pt endorses having slight loss in appetite PTA due to worsening chest pain. This has since resolved. Denies any recent wt loss from his UBW of 185 lb.   Body mass index is 29 kg/m. Patient meets criteria for overweight based on current BMI.   Current diet order is heart healthy, patient is consuming approximately 75% of meals at this time. Labs and medications reviewed.   No nutrition interventions warranted at this time. If nutrition issues arise, please consult RD.   Vanessa Kick RD, LDN Clinical Nutrition Pager # [phone]

## 2018-06-10 NOTE — ED Notes (Signed)
ED TO INPATIENT HANDOFF REPORT  ED Nurse Name and Phone #: Selena Batten [phone]   S Name/Age/Gender Jeremiah [name] 43 y.o. male Room/Bed: WA21/WA21  Code Status   Code Status: Full Code  Home/SNF/Other Home Patient oriented to: self, place, time and situation Is this baseline? Yes   Triage Complete: Triage complete  Chief Complaint blood clots  Triage Note Patient arrived by self from home. Pt c/o chest pain and SOB. Pt states pain is on LFT side of chest. Pt states if he takes a deep breath pain goes up into his head. Pt was seen yesterday at [facility] ED yesterday and treated for blood clot in lung per patient statement. Pt was discharged w/ blood thinner.   NSR on cardiac monitor currently.    Allergies Allergies  Allergen Reactions  . Amoxicillin Anaphylaxis    Did it involve swelling of the face/tongue/throat, SOB, or low BP? Yes Did it involve sudden or severe rash/hives, skin peeling, or any reaction on the inside of your mouth or nose? Yes Did you need to seek medical attention at a hospital or doctor's office? Yes When did it last happen?43 YEARS OLD If all above answers are "NO", may proceed with cephalosporin use.   Marland Kitchen Penicillins Anaphylaxis    Did it involve swelling of the face/tongue/throat, SOB, or low BP? Yes Did it involve sudden or severe rash/hives, skin peeling, or any reaction on the inside of your mouth or nose? Yes Did you need to seek medical attention at a hospital or doctor's office? Yes When did it last happen?CHILDHOOD If all above answers are "NO", may proceed with cephalosporin use.     Level of Care/Admitting Diagnosis ED Disposition    ED Disposition Condition Comment   Admit  Hospital Area: [facility] [[id number]]  Level of Care: Telemetry [5]  Admit to tele based on following criteria: Monitor for Ischemic changes  Diagnosis: Chest pain [654650]  Admitting Physician: Eduard Clos [phone]  Attending  Physician: Eduard Clos [phone]  PT Class (Do Not Modify): Observation [104]  PT Acc Code (Do Not Modify): Observation [10022]       B Medical/Surgery History Past Medical History:  Diagnosis Date  . Anemia   . Asthma    as a child   . Blood clot in vein   . DVT (deep venous thrombosis) (HCC)   . Pulmonary embolism (HCC)    History reviewed. No pertinent surgical history.   A IV Location/Drains/Wounds Patient Lines/Drains/Airways Status   Active Line/Drains/Airways    Name:   Placement date:   Placement time:   Site:   Days:   Peripheral IV 06/10/18 Left   06/10/18    0330    -   less than 1          Intake/Output Last 24 hours No intake or output data in the 24 hours ending 06/10/18 0416  Labs/Imaging Results for orders placed or performed during the hospital encounter of 06/10/18 (from the past 48 hour(s))  Basic metabolic panel     Status: Abnormal   Collection Time: 06/10/18  1:32 AM  Result Value Ref Range   Sodium 136 135 - 145 mmol/L   Potassium 3.9 3.5 - 5.1 mmol/L   Chloride 104 98 - 111 mmol/L   CO2 22 22 - 32 mmol/L   Glucose, Bld 133 (H) 70 - 99 mg/dL   BUN 13 6 - 20 mg/dL   Creatinine, Ser 2.75 0.61 - 1.24  mg/dL   Calcium 8.8 (L) 8.9 - 10.3 mg/dL   GFR calc non Af Amer >60 >60 mL/min   GFR calc Af Amer >60 >60 mL/min   Anion gap 10 5 - 15    Comment: Performed at [facility], [address] [place], [place] [address]  CBC     Status: Abnormal   Collection Time: 06/10/18  1:32 AM  Result Value Ref Range   WBC 10.9 (H) 4.0 - 10.5 K/uL   RBC 4.94 4.22 - 5.81 MIL/uL   Hemoglobin 15.6 13.0 - 17.0 g/dL   HCT 25.8 94.8 - 34.7 %   MCV 94.3 80.0 - 100.0 fL   MCH 31.6 26.0 - 34.0 pg   MCHC 33.5 30.0 - 36.0 g/dL   RDW 58.3 07.4 - 60.0 %   Platelets 255 150 - 400 K/uL   nRBC 0.0 0.0 - 0.2 %    Comment: Performed at [facility], [address] [place], [place] [address]  I-stat troponin, ED     Status:  None   Collection Time: 06/10/18  1:36 AM  Result Value Ref Range   Troponin i, poc 0.03 0.00 - 0.08 ng/mL   Comment 3            Comment: Due to the release kinetics of cTnI, a negative result within the first hours of the onset of symptoms does not rule out myocardial infarction with certainty. If myocardial infarction is still suspected, repeat the test at appropriate intervals.    Dg Chest 2 View  Result Date: 06/10/2018 CLINICAL DATA:  43 year old male with chest pain and shortness of breath. Bilateral pulmonary emboli diagnosed by CTA on 06/07/2018. EXAM: CHEST - 2 VIEW COMPARISON:  Radiographs 06/09/2018.  CTA 06/07/2018. FINDINGS: Wedge-shaped and peripheral left lower lung opacity redemonstrated and highly suggestive of pulmonary infarct in this clinical setting. Continued low lung volumes. Additional streaky retrocardiac opacity. No pneumothorax, pulmonary edema or pleural effusion. Normal cardiac size and mediastinal contours. Visualized tracheal air column is within normal limits. Negative visible bowel gas pattern. No acute osseous abnormality identified. IMPRESSION: 1. Left lower lung pulmonary infarct.  No pleural effusion. 2. Low lung volumes, such that additional streaky lung base opacity may be atelectasis. Electronically Signed   By: Odessa Fleming M.D.   On: 06/10/2018 01:37   Dg Chest 2 View  Result Date: 06/09/2018 CLINICAL DATA:  Left-sided chest pain which shortness-of-breath. Pulmonary embolism seen on recent CT. EXAM: CHEST - 2 VIEW COMPARISON:  08/09/2016 and chest CT 06/07/2018 FINDINGS: Lungs are somewhat hypoinflated with subtle hazy density over the lateral left base as seen on recent CT likely focal infarction. No significant effusion. Cardiomediastinal silhouette and remainder of the exam is unchanged. IMPRESSION: Subtle hazy density over the lateral left base as seen on recent CT likely infarction. Electronically Signed   By: Elberta Fortis M.D.   On: 06/09/2018 13:39     Pending Labs Unresulted Labs (From admission, onward)    Start     Ordered   06/10/18 0500  HIV antibody (Routine Testing)  Tomorrow morning,   R     06/10/18 0352   06/10/18 0500  Basic metabolic panel  Tomorrow morning,   R     06/10/18 0352   06/10/18 0500  CBC  Tomorrow morning,   R     06/10/18 0352   06/10/18 0500  Troponin I - Tomorrow AM 0500  Tomorrow morning,   R  06/10/18 0355          Vitals/Pain Today's Vitals   06/10/18 0330 06/10/18 0343 06/10/18 0400 06/10/18 0410  BP: (!) 138/99  (!) 139/99   Pulse: 74 78 77 80  Resp: Temp: 98.9 F (37.2 C)     TempSrc:      SpO2: 98% 96% 94% 96%  Weight:      Height:      PainSc:        Isolation Precautions No active isolations  Medications Medications  acetaminophen (TYLENOL) tablet 650 mg (has no administration in time range)    Or  acetaminophen (TYLENOL) suppository 650 mg (has no administration in time range)  ondansetron (ZOFRAN) tablet 4 mg (has no administration in time range)    Or  ondansetron (ZOFRAN) injection 4 mg (has no administration in time range)  0.9 %  sodium chloride infusion (has no administration in time range)  morphine 2 MG/ML injection 2 mg (has no administration in time range)  Rivaroxaban (XARELTO) tablet 15 mg (has no administration in time range)  sodium chloride flush (NS) 0.9 % injection 3 mL (3 mLs Intravenous Given 06/10/18 0136)  morphine 4 MG/ML injection 6 mg (6 mg Intravenous Given 06/10/18 0223)    Mobility walks Low fall risk   Focused Assessments Neuro Assessment Handoff:  Swallow screen pass? Yes  Cardiac Rhythm: Normal sinus rhythm       Neuro Assessment:   Neuro Checks:      Last Documented NIHSS Modified Score:   Has TPA been given? No If patient is a Neuro Trauma and patient is going to OR before floor call report to 4N Charge nurse: [phone] or [phone]     R Recommendations: See Admitting Provider Note  Report given  to:   Additional Notes: Left pulmonary infarct

## 2018-06-10 NOTE — ED Notes (Addendum)
Pt placed on cardiac monitor in triage. EKG not performed due to pt having two done over the past two days, however he is connected to 12-lead cables if one is needed. RN made aware.

## 2018-06-10 NOTE — Progress Notes (Signed)
PROGRESS NOTE    CHARLIS STUCHELL  LEX:[id number] DOB: [birth year] DOA: 06/10/2018 PCP: No primary care provider on file.   Brief Narrative: 43 year old with past medical history relevant for DVT/PE admitted with recalcitrant left-sided chest pain in the setting of diagnosis of bilateral PE.   Assessment & Plan:   Principal Problem:   Chest pain Active Problems:   Pulmonary embolism, bilateral (HCC)   #) Chest pain/bilateral PE: It is unclear if this is related exclusively to the pulmonary embolism as well as possible pulmonary infarct.  There was evidence of an early pulmonary infarct seen on CT imaging.  Likely there is a component of pleural pruritus that is causing the patient's symptoms.  It does not seem typical for ACS.  Other intrathoracic catastrophe such as dissection or rupture of the aorta also are quite unlikely. -Repeat echo pending -Continue rivaroxaban - Start oral oxycodone PRN for pain control -Lidocaine patch -IV Toradol for pain control   Fluids: Gentle IV fluids Electrolytes: Monitor and supplement Nutrition: Regular diet  Prophylaxis: Rivaroxaban  Disposition: Pending pain control  Full code   Consultants:   None  Procedures:  Echo ordered 06/10/2018: Pending  Antimicrobials:  None   Subjective: This morning the patient continues to report pain.  He reports initially the pain was in his leg on the left side however is now migrated to the left side of his chest.  He describes as a ball.  He reports the deep breaths make the pain worse.  He denies any nausea, vomiting, syncope, orthopnea.  Objective: Vitals:   06/10/18 0343 06/10/18 0400 06/10/18 0410 06/10/18 0448  BP:  (!) 139/99  (!) 150/105  Pulse: 78 77 80 76  Resp: 17 12 15 16   Temp:    98.5 F (36.9 C)  TempSrc:    Oral  SpO2: 96% 94% 96% 100%  Weight:      Height:        Intake/Output Summary (Last 24 hours) at 06/10/2018 0940 Last data filed at 06/10/2018 0600 Gross per 24  hour  Intake 500 ml  Output -  Net 500 ml   Filed Weights   06/10/18 0106  Weight: 84 kg    Examination:  General exam: Mildly uncomfortable appearing Respiratory system: Clear to auscultation. Respiratory effort normal.  Difficulty taking deep breath in, pain on palpation of left axillary area as well as left lateral chest Cardiovascular system: Tachycardic, regular rhythm, no murmurs Gastrointestinal system: Soft, nondistended, no rebound or guarding, plus bowel sounds Central nervous system: Alert and oriented.  Grossly intact, moving all extremities Extremities: No lower extremity edema Skin: No rashe over visible skin Psychiatry: Judgement and insight appear normal. Mood & affect appropriate.     Data Reviewed: I have personally reviewed following labs and imaging studies  CBC: Recent Labs  Lab 06/09/18 1218 06/10/18 0132 06/10/18 0423  WBC 11.8* 10.9* 8.7  NEUTROABS 8.5*  --   --   HGB 15.5 15.6 12.2*  HCT 45.7 46.6 37.3*  MCV 91.6 94.3 96.4  PLT 249 255 193   Basic Metabolic Panel: Recent Labs  Lab 06/07/18 1258 06/09/18 1218 06/10/18 0132 06/10/18 0423  NA 139 137 136 136  K 4.1 4.2 3.9 4.2  CL 107 108 104 104  CO2 26 23 22 24   GLUCOSE 88 105* 133* 109*  BUN 11 11 13 13   CREATININE 1.30* 1.41* 1.22 1.16  CALCIUM 9.1 8.5* 8.8* 8.5*   GFR: Estimated Creatinine Clearance: 85.1 mL/min (by C-G  formula based on SCr of 1.16 mg/dL). Liver Function Tests: No results for input(s): AST, ALT, ALKPHOS, BILITOT, PROT, ALBUMIN in the last 168 hours. No results for input(s): LIPASE, AMYLASE in the last 168 hours. No results for input(s): AMMONIA in the last 168 hours. Coagulation Profile: No results for input(s): INR, PROTIME in the last 168 hours. Cardiac Enzymes: Recent Labs  Lab 06/10/18 0423  TROPONINI <0.03   BNP (last 3 results) No results for input(s): PROBNP in the last 8760 hours. HbA1C: No results for input(s): HGBA1C in the last 72 hours.  CBG: No results for input(s): GLUCAP in the last 168 hours. Lipid Profile: No results for input(s): CHOL, HDL, LDLCALC, TRIG, CHOLHDL, LDLDIRECT in the last 72 hours. Thyroid Function Tests: No results for input(s): TSH, T4TOTAL, FREET4, T3FREE, THYROIDAB in the last 72 hours. Anemia Panel: No results for input(s): VITAMINB12, FOLATE, FERRITIN, TIBC, IRON, RETICCTPCT in the last 72 hours. Sepsis Labs: No results for input(s): PROCALCITON, LATICACIDVEN in the last 168 hours.  No results found for this or any previous visit (from the past 240 hour(s)).       Radiology Studies: Dg Chest 2 View  Result Date: 06/10/2018 CLINICAL DATA:  43 year old male with chest pain and shortness of breath. Bilateral pulmonary emboli diagnosed by CTA on 06/07/2018. EXAM: CHEST - 2 VIEW COMPARISON:  Radiographs 06/09/2018.  CTA 06/07/2018. FINDINGS: Wedge-shaped and peripheral left lower lung opacity redemonstrated and highly suggestive of pulmonary infarct in this clinical setting. Continued low lung volumes. Additional streaky retrocardiac opacity. No pneumothorax, pulmonary edema or pleural effusion. Normal cardiac size and mediastinal contours. Visualized tracheal air column is within normal limits. Negative visible bowel gas pattern. No acute osseous abnormality identified. IMPRESSION: 1. Left lower lung pulmonary infarct.  No pleural effusion. 2. Low lung volumes, such that additional streaky lung base opacity may be atelectasis. Electronically Signed   By: Odessa Fleming M.D.   On: 06/10/2018 01:37   Dg Chest 2 View  Result Date: 06/09/2018 CLINICAL DATA:  Left-sided chest pain which shortness-of-breath. Pulmonary embolism seen on recent CT. EXAM: CHEST - 2 VIEW COMPARISON:  08/09/2016 and chest CT 06/07/2018 FINDINGS: Lungs are somewhat hypoinflated with subtle hazy density over the lateral left base as seen on recent CT likely focal infarction. No significant effusion. Cardiomediastinal silhouette and  remainder of the exam is unchanged. IMPRESSION: Subtle hazy density over the lateral left base as seen on recent CT likely infarction. Electronically Signed   By: Elberta Fortis M.D.   On: 06/09/2018 13:39        Scheduled Meds: . lidocaine  1 patch Transdermal Q24H  . polyethylene glycol  17 g Oral BID  . rivaroxaban  15 mg Oral BID WC  . senna-docusate  2 tablet Oral BID   Continuous Infusions: . sodium chloride 75 mL/hr at 06/10/18 0600     LOS: 0 days    Time spent: 35    Delaine Lame, MD [facility]  If 7PM-7AM, please contact night-coverage [email or web address] Password [email or web address] 06/10/2018, 9:40 AM

## 2018-06-10 NOTE — ED Triage Notes (Signed)
Patient arrived by self from home. Pt c/o chest pain and SOB. Pt states pain is on LFT side of chest. Pt states if he takes a deep breath pain goes up into his head. Pt was seen yesterday at [facility] ED yesterday and treated for blood clot in lung per patient statement. Pt was discharged w/ blood thinner.   NSR on cardiac monitor currently.

## 2018-06-10 NOTE — Plan of Care (Signed)

## 2018-06-11 MED ORDER — SENNOSIDES-DOCUSATE SODIUM 8.6-50 MG PO TABS: 2.0000 | ORAL_TABLET | Freq: Two times a day (BID) | ORAL | 0 refills | Status: AC

## 2018-06-11 MED ORDER — MAGNESIUM CITRATE PO SOLN: 1.0000 | Freq: Once | ORAL | 2 refills | Status: AC

## 2018-06-11 MED ORDER — MORPHINE SULFATE 15 MG PO TABS: 7.5000 mg | ORAL_TABLET | Freq: Three times a day (TID) | ORAL | 0 refills | Status: AC | PRN

## 2018-06-11 MED ORDER — MAGNESIUM CITRATE PO SOLN
1.0000 | Freq: Once | ORAL | Status: AC
  Administered 2018-06-11: 1 via ORAL
  Filled 2018-06-11: qty 296

## 2018-06-11 MED ORDER — PREDNISONE 50 MG PO TABS: 50.0000 mg | ORAL_TABLET | Freq: Every day | ORAL | 0 refills | Status: AC

## 2018-06-11 MED ORDER — LIDOCAINE 5 % EX PTCH: 1.0000 | MEDICATED_PATCH | CUTANEOUS | 0 refills | Status: DC

## 2018-06-11 MED ORDER — POLYETHYLENE GLYCOL 3350 17 G PO PACK: 17.0000 g | PACK | Freq: Two times a day (BID) | ORAL | 0 refills | Status: DC

## 2018-06-11 MED FILL — MORPHINE SULFATE IR 15 MG T: 15 | 8 days supply | Qty: 12 | Fill #0

## 2018-06-11 MED FILL — predniSONE 50 MG TABS: 50 | 5 days supply | Qty: 5 | Fill #0

## 2018-06-11 NOTE — TOC Transition Note (Signed)
Transition of Care [facility]) - CM/SW Discharge Note   Patient Details  Name: Jeremiah [name] MRN: [id number] Date of Birth: 07/13/1975  Transition of Care [facility]) CM/SW Contact:  Lanier Clam, RN Phone Number: 06/11/2018, 12:56 PM   Clinical Narrative: Noted recent email that [facility] otpt pharmacy are taking new scripts-informed patient to go there for new scripts.Patient voiced understanding.      Final next level of care: Home/Self Care Barriers to Discharge: No Barriers Identified   Patient Goals and CMS Choice Patient states their goals for this hospitalization and ongoing recovery are:: (go home)      Discharge Placement                       Discharge Plan and Services                          Social Determinants of Health (SDOH) Interventions     Readmission Risk Interventions No flowsheet data found.

## 2018-06-11 NOTE — TOC Transition Note (Signed)
Transition of Care [facility]) - CM/SW Discharge Note   Patient Details  Name: Jeremiah [name] MRN: [id number] Date of Birth: [birth year]  Transition of Care [facility]) CM/SW Contact:  Lanier Clam, RN Phone Number: 06/11/2018, 2:00 PM   Clinical Narrative:   [name]-they will see patient today if d/c before 4:30p-they need to get pcp appt set up, & process for orange card-then he will have access to healthcare,& pharmacy @ a reasonable cost.    Final next level of care: Home/Self Care Barriers to Discharge: No Barriers Identified   Patient Goals and CMS Choice Patient states their goals for this hospitalization and ongoing recovery are:: (go home)      Discharge Placement                       Discharge Plan and Services                          Social Determinants of Health (SDOH) Interventions     Readmission Risk Interventions No flowsheet data found.

## 2018-06-11 NOTE — Plan of Care (Signed)
  Problem: Health Behavior/Discharge Planning: Goal: Ability to manage health-related needs will improve Outcome: Completed/Met

## 2018-06-11 NOTE — Discharge Summary (Signed)
Physician Discharge Summary  Jeremiah [name] ZHY:[id number] DOB: 12-25-1975 DOA: 06/10/2018  PCP: No primary care provider on file.  Admit date: 06/10/2018 Discharge date: 06/11/2018  Admitted From: Home Disposition:  Home  Recommendations for Outpatient Follow-up:  1. Follow up with PCP in 1-2 weeks 2. Please obtain BMP/CBC in one week   Home Health: no Equipment/Devices:none  Discharge Condition: stable CODE STATUS: FULL Diet recommendation: Regular   Brief/Interim Summary:  #) Left-sided chest pain due to pleuritis: Patient was admitted with recalcitrant left-sided chest pain that existed in a band from his back up to his axilla on the left side.  He had been several ER visits for this.  He was admitted and given gentle IV fluids, IV pain control as well as oral pain control.  He was discharged home on oral morphine, lidocaine patch, a short course of steroids to help with inflammation as well as short course of oral NSAIDs. Patient's troponins were negative.  EKG did not show any evidence of infarct.  #) Pulmonary embolism: Patient had recent diagnosis of pulmonary embolism and was on rivaroxaban.  He was continued on rivaroxaban.  He did have a repeat echo that was essentially normal.  Patient on CT imaging had evidence of possible early pulmonary infarct this was attributed to the cause of his left-sided chest pain/pleuritis.   Discharge Diagnoses:  Principal Problem:   Chest pain Active Problems:   Pulmonary embolism, bilateral (HCC)    Discharge Instructions   Allergies as of 06/11/2018      Reactions   Amoxicillin Anaphylaxis   Did it involve swelling of the face/tongue/throat, SOB, or low BP? Yes Did it involve sudden or severe rash/hives, skin peeling, or any reaction on the inside of your mouth or nose? Yes Did you need to seek medical attention at a hospital or doctor's office? Yes When did it last happen?43 YEARS OLD If all above answers are "NO", may  proceed with cephalosporin use.   Penicillins Anaphylaxis   Did it involve swelling of the face/tongue/throat, SOB, or low BP? Yes Did it involve sudden or severe rash/hives, skin peeling, or any reaction on the inside of your mouth or nose? Yes Did you need to seek medical attention at a hospital or doctor's office? Yes When did it last happen?CHILDHOOD If all above answers are "NO", may proceed with cephalosporin use.      Medication List    STOP taking these medications   ibuprofen 600 MG tablet Commonly known as:  ADVIL,MOTRIN   oxyCODONE 15 MG immediate release tablet Commonly known as:  ROXICODONE     TAKE these medications   cyclobenzaprine 5 MG tablet Commonly known as:  FLEXERIL Take 1-2 tablets (5-10 mg total) by mouth 3 (three) times daily as needed for up to 7 days for muscle spasms.   lidocaine 5 % Commonly known as:  LIDODERM Place 1 patch onto the skin daily. Remove & Discard patch within 12 hours or as directed by MD Start taking on:  June 12, 2018   magnesium citrate Soln Take 296 mLs (1 Bottle total) by mouth once for 1 dose.   morphine 15 MG tablet Commonly known as:  MSIR Take 0.5 tablets (7.5 mg total) by mouth every 8 (eight) hours as needed for up to 7 days for severe pain.   naproxen 500 MG tablet Commonly known as:  NAPROSYN Take 1 tablet (500 mg total) by mouth 2 (two) times daily for 5 days.   polyethylene glycol  packet Commonly known as:  MIRALAX / GLYCOLAX Take 17 g by mouth 2 (two) times daily.   predniSONE 50 MG tablet Commonly known as:  DELTASONE Take 1 tablet (50 mg total) by mouth daily for 5 days.   Rivaroxaban 15 & 20 MG Tbpk Commonly known as:  Xarelto Starter Pack Take as directed on package: Start with one 15mg  tablet by mouth twice a day with food. On Day 22, switch to one 20mg  tablet once a day with food.   senna-docusate 8.6-50 MG tablet Commonly known as:  Senokot-S Take 2 tablets by mouth 2 (two) times daily for  30 days.       Allergies  Allergen Reactions  . Amoxicillin Anaphylaxis    Did it involve swelling of the face/tongue/throat, SOB, or low BP? Yes Did it involve sudden or severe rash/hives, skin peeling, or any reaction on the inside of your mouth or nose? Yes Did you need to seek medical attention at a hospital or doctor's office? Yes When did it last happen?43 YEARS OLD If all above answers are "NO", may proceed with cephalosporin use.   Marland Kitchen Penicillins Anaphylaxis    Did it involve swelling of the face/tongue/throat, SOB, or low BP? Yes Did it involve sudden or severe rash/hives, skin peeling, or any reaction on the inside of your mouth or nose? Yes Did you need to seek medical attention at a hospital or doctor's office? Yes When did it last happen?CHILDHOOD If all above answers are "NO", may proceed with cephalosporin use.     Consultations:  None   Procedures/Studies: Dg Chest 2 View  Result Date: 06/10/2018 CLINICAL DATA:  43 year old male with chest pain and shortness of breath. Bilateral pulmonary emboli diagnosed by CTA on 06/07/2018. EXAM: CHEST - 2 VIEW COMPARISON:  Radiographs 06/09/2018.  CTA 06/07/2018. FINDINGS: Wedge-shaped and peripheral left lower lung opacity redemonstrated and highly suggestive of pulmonary infarct in this clinical setting. Continued low lung volumes. Additional streaky retrocardiac opacity. No pneumothorax, pulmonary edema or pleural effusion. Normal cardiac size and mediastinal contours. Visualized tracheal air column is within normal limits. Negative visible bowel gas pattern. No acute osseous abnormality identified. IMPRESSION: 1. Left lower lung pulmonary infarct.  No pleural effusion. 2. Low lung volumes, such that additional streaky lung base opacity may be atelectasis. Electronically Signed   By: Odessa Fleming M.D.   On: 06/10/2018 01:37   Dg Chest 2 View  Result Date: 06/09/2018 CLINICAL DATA:  Left-sided chest pain which  shortness-of-breath. Pulmonary embolism seen on recent CT. EXAM: CHEST - 2 VIEW COMPARISON:  08/09/2016 and chest CT 06/07/2018 FINDINGS: Lungs are somewhat hypoinflated with subtle hazy density over the lateral left base as seen on recent CT likely focal infarction. No significant effusion. Cardiomediastinal silhouette and remainder of the exam is unchanged. IMPRESSION: Subtle hazy density over the lateral left base as seen on recent CT likely infarction. Electronically Signed   By: Elberta Fortis M.D.   On: 06/09/2018 13:39   Ct Angio Chest Pe W And/or Wo Contrast  Result Date: 06/07/2018 CLINICAL DATA:  Left-sided chest pain, history of PE EXAM: CT ANGIOGRAPHY CHEST WITH CONTRAST TECHNIQUE: Multidetector CT imaging of the chest was performed using the standard protocol during bolus administration of intravenous contrast. Multiplanar CT image reconstructions and MIPs were obtained to evaluate the vascular anatomy. CONTRAST:  45mL OMNIPAQUE IOHEXOL 350 MG/ML SOLN COMPARISON:  09/08/2017 FINDINGS: Cardiovascular: Thoracic aorta shows no significant atherosclerotic changes or aneurysmal dilatation. No cardiac enlargement is seen. Pulmonary  artery is well visualized with a normal branching pattern. There are peripheral branches in the lower lobes bilaterally with filling defects consistent with distal pulmonary emboli. No significant upper lobe involvement is noted. Mediastinum/Nodes: Thoracic inlet is within normal limits. No hilar or mediastinal adenopathy is noted. The esophagus as visualized is within normal limits. Lungs/Pleura: Mild emphysematous changes are identified. The lungs are well aerated with the exception of minimal pleural based densities in the left lower lobe most likely representing areas of developing pulmonary infarct. Upper Abdomen: Visualized upper abdomen shows no acute abnormality. Musculoskeletal: Bony structures are within normal limits. Review of the MIP images confirms the above  findings. IMPRESSION: Changes consistent with bilateral lower lobe pulmonary emboli. There are changes along the lateral pleural margin of the left lower lobe suspicious for early changes of pulmonary infarct. Emphysema (ICD10-J43.9). Electronically Signed   By: Alcide Clever M.D.   On: 06/07/2018 15:21   Vas [place] Lower Extremity Venous (dvt) (only [facility])  Result Date: 06/09/2018  Lower Venous Study Indications: Pulmonary embolism, and history of DVT and PE.  Performing Technologist: Gertie Fey MHA, RDMS, RVT, RDCS  Examination Guidelines: A complete evaluation includes B-mode imaging, spectral Doppler, color Doppler, and power Doppler as needed of all accessible portions of each vessel. Bilateral testing is considered an integral part of a complete examination. Limited examinations for reoccurring indications may be performed as noted.  Right Venous Findings: +---------+---------------+---------+-----------+----------+-------+          CompressibilityPhasicitySpontaneityPropertiesSummary +---------+---------------+---------+-----------+----------+-------+ CFV      Full           Yes      Yes                          +---------+---------------+---------+-----------+----------+-------+ SFJ      Full                                                 +---------+---------------+---------+-----------+----------+-------+ FV Prox  Full                                                 +---------+---------------+---------+-----------+----------+-------+ FV Mid   Full                                                 +---------+---------------+---------+-----------+----------+-------+ FV DistalFull                                                 +---------+---------------+---------+-----------+----------+-------+ PFV      Full                                                 +---------+---------------+---------+-----------+----------+-------+ POP      Full           Yes  Yes                          +---------+---------------+---------+-----------+----------+-------+ PTV      Full                                                 +---------+---------------+---------+-----------+----------+-------+ PERO     Full                                                 +---------+---------------+---------+-----------+----------+-------+  Left Venous Findings: +---------+---------------+---------+-----------+----------+-------+          CompressibilityPhasicitySpontaneityPropertiesSummary +---------+---------------+---------+-----------+----------+-------+ CFV      Full           Yes      Yes                          +---------+---------------+---------+-----------+----------+-------+ SFJ      Full                                                 +---------+---------------+---------+-----------+----------+-------+ FV Prox  Full                                                 +---------+---------------+---------+-----------+----------+-------+ FV Mid   Full                                                 +---------+---------------+---------+-----------+----------+-------+ FV DistalFull                                                 +---------+---------------+---------+-----------+----------+-------+ PFV      Full                                                 +---------+---------------+---------+-----------+----------+-------+ POP      Full           Yes      Yes                          +---------+---------------+---------+-----------+----------+-------+ PTV      Full                                                 +---------+---------------+---------+-----------+----------+-------+ PERO     Full                                                 +---------+---------------+---------+-----------+----------+-------+  Summary: Right: There is no evidence of deep vein thrombosis in the lower extremity. No  cystic structure found in the popliteal fossa. Left: There is no evidence of deep vein thrombosis in the lower extremity. No cystic structure found in the popliteal fossa.  *See table(s) above for measurements and observations. Electronically signed by Coral Else MD on 06/09/2018 at 7:59:49 AM.    Final     Echo 06/10/2018:  1. The left ventricle has normal systolic function with an ejection fraction of 60-65%. The cavity size was normal. Left ventricular diastolic parameters were normal.  2. The right ventricle has normal systolic function. The cavity was normal. There is no increase in right ventricular wall thickness.  3. No evidence of mitral valve stenosis. Trivial mitral regurgitation.  4. No stenosis of the aortic valve.  5. The aortic root and ascending aorta are normal in size and structure.  6. IVC not visualized. No complete TR doppler jet so unable to estimate PA systolic pressure.  7. Normal study.   Subjective:   Discharge Exam: Vitals:   06/10/18 2129 06/11/18 0505  BP: (!) 130/92 139/89  Pulse: 76 68  Resp: 20 16  Temp: 98.3 F (36.8 C) 99.1 F (37.3 C)  SpO2: 97% 97%   Vitals:   06/10/18 0448 06/10/18 1450 06/10/18 2129 06/11/18 0505  BP: (!) 150/105 (!) 127/91 (!) 130/92 139/89  Pulse: 76 72 76 68  Resp: 16 (!) Temp: 98.5 F (36.9 C) 98.7 F (37.1 C) 98.3 F (36.8 C) 99.1 F (37.3 C)  TempSrc: Oral Oral Oral Oral  SpO2: 100% 96% 97% 97%  Weight:      Height:       General exam: NAD Respiratory system: Clear to auscultation. Respiratory effort normal.  Difficulty taking deep breath in, mild pain on palpation of left axillary area as well as left lateral chest Cardiovascular system:  Regular rate, regular rhythm, no murmurs Gastrointestinal system: Soft, nondistended, no rebound or guarding, plus bowel sounds Central nervous system: Alert and oriented.  Grossly intact, moving all extremities Extremities: No lower extremity edema Skin: No  rashe over visible skin Psychiatry: Judgement and insight appear normal. Mood & affect appropriate.      The results of significant diagnostics from this hospitalization (including imaging, microbiology, ancillary and laboratory) are listed below for reference.     Microbiology: No results found for this or any previous visit (from the past 240 hour(s)).   Labs: BNP (last 3 results) No results for input(s): BNP in the last 8760 hours. Basic Metabolic Panel: Recent Labs  Lab 06/07/18 1258 06/09/18 1218 06/10/18 0132 06/10/18 0423  NA 139 137 136 136  K 4.1 4.2 3.9 4.2  CL 107 108 104 104  CO2 GLUCOSE 88 105* 133* 109*  BUN CREATININE 1.30* 1.41* 1.22 1.16  CALCIUM 9.1 8.5* 8.8* 8.5*   Liver Function Tests: No results for input(s): AST, ALT, ALKPHOS, BILITOT, PROT, ALBUMIN in the last 168 hours. No results for input(s): LIPASE, AMYLASE in the last 168 hours. No results for input(s): AMMONIA in the last 168 hours. CBC: Recent Labs  Lab 06/09/18 1218 06/10/18 0132 06/10/18 0423  WBC 11.8* 10.9* 8.7  NEUTROABS 8.5*  --   --   HGB 15.5 15.6 12.2*  HCT 45.7 46.6 37.3*  MCV 91.6 94.3 96.4  PLT 249 255 193   Cardiac Enzymes: Recent Labs  Lab 06/10/18 0423  TROPONINI <0.03   BNP: Invalid input(s): POCBNP CBG: No results for input(s): GLUCAP in the last 168 hours. D-Dimer No results for input(s): DDIMER in the last 72 hours. Hgb A1c No results for input(s): HGBA1C in the last 72 hours. Lipid Profile No results for input(s): CHOL, HDL, LDLCALC, TRIG, CHOLHDL, LDLDIRECT in the last 72 hours. Thyroid function studies No results for input(s): TSH, T4TOTAL, T3FREE, THYROIDAB in the last 72 hours.  Invalid input(s): FREET3 Anemia work up No results for input(s): VITAMINB12, FOLATE, FERRITIN, TIBC, IRON, RETICCTPCT in the last 72 hours. Urinalysis    Component Value Date/Time   COLORURINE YELLOW 01/02/2015 0932   APPEARANCEUR CLEAR  01/02/2015 0932   LABSPEC 1.020 09/05/2016 1549   PHURINE 5.5 09/05/2016 1549   GLUCOSEU NEGATIVE 09/05/2016 1549   HGBUR TRACE (A) 09/05/2016 1549   BILIRUBINUR NEGATIVE 09/05/2016 1549   KETONESUR NEGATIVE 09/05/2016 1549   PROTEINUR NEGATIVE 09/05/2016 1549   UROBILINOGEN 0.2 09/05/2016 1549   NITRITE NEGATIVE 09/05/2016 1549   LEUKOCYTESUR NEGATIVE 09/05/2016 1549   Sepsis Labs Invalid input(s): PROCALCITONIN,  WBC,  LACTICIDVEN Microbiology No results found for this or any previous visit (from the past 240 hour(s)).   Time coordinating discharge: 35  SIGNED:   Delaine Lame, MD  [facility] 06/11/2018, 9:25 AM  If 7PM-7AM, please contact night-coverage [email or web address] Password [email or web address]

## 2018-06-11 NOTE — Discharge Instructions (Signed)
Information on my medicine - XARELTO (rivaroxaban)  This medication education was reviewed with me or my healthcare representative as part of my discharge preparation.   WHY WAS XARELTO PRESCRIBED FOR YOU? Xarelto was prescribed to treat blood clots that may have been found in the veins of your legs (deep vein thrombosis) or in your lungs (pulmonary embolism) and to reduce the risk of them occurring again.  What do you need to know about Xarelto? The starting dose is one 15 mg tablet taken TWICE daily with food for the FIRST 21 DAYS then after all 15 mg tablets have been taken  the dose is changed to one 20 mg tablet taken ONCE A DAY with your evening meal.  DO NOT stop taking Xarelto without talking to the health care provider who prescribed the medication.  Refill your prescription for 20 mg tablets before you run out.  After discharge, you should have regular check-up appointments with your healthcare provider that is prescribing your Xarelto.  In the future your dose may need to be changed if your kidney function changes by a significant amount.  What do you do if you miss a dose? If you are taking Xarelto TWICE DAILY and you miss a dose, take it as soon as you remember. You may take two 15 mg tablets (total 30 mg) at the same time then resume your regularly scheduled 15 mg twice daily the next day.  If you are taking Xarelto ONCE DAILY and you miss a dose, take it as soon as you remember on the same day then continue your regularly scheduled once daily regimen the next day. Do not take two doses of Xarelto at the same time.   Important Safety Information Xarelto is a blood thinner medicine that can cause bleeding. You should call your healthcare provider right away if you experience any of the following: ? Bleeding from an injury or your nose that does not stop. ? Unusual colored urine (red or dark brown) or unusual colored stools (red or black). ? Unusual bruising for unknown  reasons. ? A serious fall or if you hit your head (even if there is no bleeding).  Some medicines may interact with Xarelto and might increase your risk of bleeding while on Xarelto. To help avoid this, consult your healthcare provider or pharmacist prior to using any new prescription or non-prescription medications, including herbals, vitamins, non-steroidal anti-inflammatory drugs (NSAIDs) and supplements.  This website has more information on Xarelto: VisitDestination.com.br. Pulmonary Embolism  A pulmonary embolism (PE) is a sudden blockage or decrease of blood flow in one lung or both lungs. Most blockages come from a blood clot that forms in a lower leg, thigh, or arm vein (deep vein thrombosis, DVT) and travels to the lungs. A clot is blood that has thickened into a gel or solid. PE is a dangerous and life-threatening condition that needs to be treated right away. What are the causes? This condition is usually caused by a blood clot that forms in a vein and moves to the lungs. In rare cases, it may be caused by air, fat, part of a tumor, or other tissue that moves through the veins and into the lungs. What increases the risk? The following factors may make you more likely to develop this condition:  Traumatic injury, such as breaking a hip or leg.  Spinal cord injury.  Orthopedic surgery, especially hip or knee replacement.  Any major surgery.  Stroke.  Having DVT.  Blood clots or blood  clotting disease.  Long-term (chronic) lung or heart disease.  Taking medicines that contain estrogen. These include birth control pills and hormone replacement therapy.  Cancer and chemotherapy.  Having a central venous catheter.  Pregnancy and the period of time after delivery (postpartum).  Being older than age 8.  Being overweight.  Smoking. What are the signs or symptoms? Symptoms of this condition usually start suddenly and include:  Shortness of breath during activity or at  rest.  Coughing or coughing up blood or blood-tinged mucus.  Chest pain that is often worse with deep breaths.  Rapid or irregular heartbeat.  Feeling light-headed or dizzy.  Fainting.  Feeling anxious.  Fever.  Sweating.  Pain and swelling in a leg. This is a symptom of DVT, which can lead to PE. How is this diagnosed? This condition may be diagnosed based on:  Your medical history.  A physical exam.  Blood tests.  CT pulmonary angiogram. This test checks blood flow in and around your lungs.  Ventilation-perfusion scan, also called a lung VQ scan. This test measures air flow and blood flow to the lungs.  Ultrasound of the legs. How is this treated? Treatment for this condition depends on many factors, such as the cause of your PE, your risk for bleeding or developing more clots, and other medical conditions you have. Treatment aims to remove, dissolve, or stop blood clots from forming or growing larger. Treatment may include:  Medicines, such as: ? Blood thinning medicines (anticoagulants) to stop clots from forming or growing. ? Medicines that dissolve clots (thrombolytics).  Procedures, such as: ? Using a flexible tube to remove a blood clot (embolectomy) or deliver medicine to destroy it (catheter-directed thrombolysis). ? Inserting a filter into a large vein that carries blood to the heart (inferior vena cava). This filter (vena cava filter) catches blood clots before they reach the lungs. ? Surgery to remove the clot (surgical embolectomy). This is rare. You may need a combination of immediate, long-term (up to 3 months after diagnosis), and extended (more than 3 months after diagnosis) treatments. Your treatment may continue for several months (maintenance therapy). You and your health care provider will work together to choose the treatment program that is best for you. Follow these instructions at home: Medicines  Take over-the-counter and prescription  medicines only as told by your health care provider.  If you are taking an anticoagulant medicine: ? Take the medicine every day at the same time each day. ? Understand what foods and drugs interact with your medicine. ? Understand the side effects of this medicine, including excessive bruising or bleeding. Ask your health care provider or pharmacist about other side effects. General instructions  Wear a medical alert bracelet or carry a medical alert card that says you have had a PE and lists what medicines you take.  Ask your health care provider when you may return to your normal activities. Avoid sitting or lying for a long time without moving.  Maintain a healthy weight. Ask your health care provider what weight is healthy for you.  Do not use any products that contain nicotine or tobacco, such as cigarettes and e-cigarettes. If you need help quitting, ask your health care provider.  Talk with your health care provider about any travel plans. It is important to make sure that you are still able to take your medicine while on trips.  Keep all follow-up visits as told by your health care provider. This is important. Contact a health care  provider if:  You missed a dose of your blood thinner medicine. Get help right away if:  You have: ? New or increased pain, swelling, warmth, or redness in an arm or leg. ? Numbness or tingling in an arm or leg. ? Shortness of breath during activity or at rest. ? A fever. ? Chest pain. ? A rapid or irregular heartbeat. ? A severe headache. ? Vision changes. ? A serious fall or accident, or you hit your head. ? Stomach (abdominal) pain. ? Blood in your vomit, stool, or urine. ? A cut that will not stop bleeding.  You cough up blood.  You feel light-headed or dizzy.  You cannot move your arms or legs.  You are confused or have memory loss. These symptoms may represent a serious problem that is an emergency. Do not wait to see if the  symptoms will go away. Get medical help right away. Call your local emergency services (911 in the [place] Do not drive yourself to the hospital. Summary  A pulmonary embolism (PE) is a sudden blockage or decrease of blood flow in one lung or both lungs. PE is a dangerous and life-threatening condition that needs to be treated right away.  Treatments for this condition usually include medicines to thin your blood (anticoagulants) or medicines to break apart blood clots (thrombolytics).  If you are given blood thinners, it is important to take the medicine every single day at the same time each day.  If you have signs of PE or DVT, call your local emergency services (911 in the [place] This information is not intended to replace advice given to you by your health care provider. Make sure you discuss any questions you have with your health care provider. Document Released: 03/03/2000 Document Revised: 10/19/2017 Document Reviewed: 04/19/2017 Elsevier Interactive Patient Education  2019 [employer].

## 2018-06-11 NOTE — TOC Transition Note (Signed)
Transition of Care [facility]) - CM/SW Discharge Note   Patient Details  Name: Jeremiah [name] MRN: [id number] Date of Birth: 1975/09/11  Transition of Care [facility]) CM/SW Contact:  Lanier Clam, RN Phone Number: 06/11/2018, 12:30 PM   Clinical Narrative: From home. Patient has already used the [facility] on 06/08/18. Has goodrx discount on mobile phone. Informed patient that [facility]-he is eligble to use the [facility] pharmacy-they can provide mail order or he can go to his current pharmacy, or any other pharmacy & pay the cost using goodrx. No further CM needs.    Final next level of care: Home/Self Care Barriers to Discharge: No Barriers Identified   Patient Goals and CMS Choice Patient states their goals for this hospitalization and ongoing recovery are:: (go home)      Discharge Placement                       Discharge Plan and Services                          Social Determinants of Health (SDOH) Interventions     Readmission Risk Interventions No flowsheet data found.

## 2018-06-14 ENCOUNTER — Other Ambulatory Visit: Payer: Self-pay

## 2018-06-14 ENCOUNTER — Encounter: Payer: Self-pay | Admitting: Family Medicine

## 2018-06-14 ENCOUNTER — Ambulatory Visit (INDEPENDENT_AMBULATORY_CARE_PROVIDER_SITE_OTHER): Payer: Self-pay | Admitting: Family Medicine

## 2018-06-14 VITALS — BP 145/85 | HR 74 | Temp 98.5°F | Resp 16 | Ht 67.0 in | Wt 188.6 lb

## 2018-06-14 DIAGNOSIS — I82462 Acute embolism and thrombosis of left calf muscular vein: Secondary | ICD-10-CM

## 2018-06-14 DIAGNOSIS — Z7689 Persons encountering health services in other specified circumstances: Secondary | ICD-10-CM

## 2018-06-14 DIAGNOSIS — I2782 Chronic pulmonary embolism: Secondary | ICD-10-CM

## 2018-06-14 LAB — POCT URINALYSIS DIP (CLINITEK)
Bilirubin, UA: NEGATIVE
Blood, UA: NEGATIVE
Glucose, UA: NEGATIVE mg/dL
Ketones, POC UA: NEGATIVE mg/dL
Leukocytes, UA: NEGATIVE
Nitrite, UA: NEGATIVE
POC PROTEIN,UA: NEGATIVE
Spec Grav, UA: 1.02 (ref 1.010–1.025)
Urobilinogen, UA: NEGATIVE E.U./dL — AB
pH, UA: 5 (ref 5.0–8.0)

## 2018-06-14 MED ORDER — RIVAROXABAN 20 MG PO TABS: 20.0000 mg | ORAL_TABLET | Freq: Every day | ORAL | 3 refills | Status: DC

## 2018-06-14 NOTE — Patient Instructions (Addendum)
You will receive a call with your lab results with in 1 week. I am testing you for blood clotting disorders. You will need to fill out the paperwork and take it over to [facility]. This is where I sent your medications.   Rivaroxaban oral tablets What is this medicine? RIVAROXABAN (ri va ROX a ban) is an anticoagulant (blood thinner). It is used to treat blood clots in the lungs or in the veins. It is also used after knee or hip surgeries to prevent blood clots. It is also used to lower the chance of stroke in people with a medical condition called atrial fibrillation. This medicine may be used for other purposes; ask your health care provider or pharmacist if you have questions. COMMON BRAND NAME(S): Xarelto, Xarelto Starter Pack What should I tell my health care provider before I take this medicine? They need to know if you have any of these conditions: -bleeding disorders -bleeding in the brain -blood in your stools (black or tarry stools) or if you have blood in your vomit -history of stomach bleeding -kidney disease -liver disease -low blood counts, like low white cell, platelet, or red cell counts -recent or planned spinal or epidural procedure -take medicines that treat or prevent blood clots -an unusual or allergic reaction to rivaroxaban, other medicines, foods, dyes, or preservatives -pregnant or trying to get pregnant -breast-feeding How should I use this medicine? Take this medicine by mouth with a glass of water. Follow the directions on the prescription label. Take your medicine at regular intervals. Do not take it more often than directed. Do not stop taking except on your doctor's advice. Stopping this medicine may increase your risk of a blood clot. Be sure to refill your prescription before you run out of medicine. If you are taking this medicine after hip or knee replacement surgery, take it with or without food. If you are taking this medicine for atrial  fibrillation, take it with your evening meal. If you are taking this medicine to treat blood clots, take it with food at the same time each day. If you are unable to swallow your tablet, you may crush the tablet and mix it in applesauce. Then, immediately eat the applesauce. You should eat more food right after you eat the applesauce containing the crushed tablet. Talk to your pediatrician regarding the use of this medicine in children. Special care may be needed. Overdosage: If you think you have taken too much of this medicine contact a poison control center or emergency room at once. NOTE: This medicine is only for you. Do not share this medicine with others. What if I miss a dose? If you take your medicine once a day and miss a dose, take the missed dose as soon as you remember. If it is almost time for your next dose, take only that dose. Do not take double or extra doses. If you take your medicine twice a day and miss a dose, take the missed dose immediately. In this instance, 2 tablets may be taken at the same time. The next day you should take 1 tablet twice a day as directed. What may interact with this medicine? Do not take this medicine with any of the following medications: -defibrotide This medicine may also interact with the following medications: -aspirin and aspirin-like medicines -certain antibiotics like erythromycin, azithromycin, and clarithromycin -certain medicines for fungal infections like ketoconazole and itraconazole -certain medicines for irregular heart beat like amiodarone, quinidine, dronedarone -certain medicines  for seizures like carbamazepine, phenytoin -certain medicines that treat or prevent blood clots like warfarin, enoxaparin, and dalteparin -conivaptan -felodipine -indinavir -lopinavir; ritonavir -NSAIDS, medicines for pain and inflammation, like ibuprofen or naproxen -ranolazine -rifampin -ritonavir -SNRIs, medicines for depression, like  desvenlafaxine, duloxetine, levomilnacipran, venlafaxine -SSRIs, medicines for depression, like citalopram, escitalopram, fluoxetine, fluvoxamine, paroxetine, sertraline -St. John's wort -verapamil This list may not describe all possible interactions. Give your health care provider a list of all the medicines, herbs, non-prescription drugs, or dietary supplements you use. Also tell them if you smoke, drink alcohol, or use illegal drugs. Some items may interact with your medicine. What should I watch for while using this medicine? Visit your healthcare professional for regular checks on your progress. You may need blood work done while you are taking this medicine. Your condition will be monitored carefully while you are receiving this medicine. It is important not to miss any appointments. Avoid sports and activities that might cause injury while you are using this medicine. Severe falls or injuries can cause unseen bleeding. Be careful when using sharp tools or knives. Consider using an Neurosurgeon. Take special care brushing or flossing your teeth. Report any injuries, bruising, or red spots on the skin to your healthcare professional. If you are going to need surgery or other procedure, tell your healthcare professional that you are taking this medicine. Wear a medical ID bracelet or chain. Carry a card that describes your disease and details of your medicine and dosage times. What side effects may I notice from receiving this medicine? Side effects that you should report to your doctor or health care professional as soon as possible: -allergic reactions like skin rash, itching or hives, swelling of the face, lips, or tongue -back pain -redness, blistering, peeling or loosening of the skin, including inside the mouth -signs and symptoms of bleeding such as bloody or black, tarry stools; red or dark-brown urine; spitting up blood or brown material that looks like coffee grounds; red spots on the  skin; unusual bruising or bleeding from the eye, gums, or nose -signs and symptoms of a blood clot such as chest pain; shortness of breath; pain, swelling, or warmth in the leg -signs and symptoms of a stroke such as changes in vision; confusion; trouble speaking or understanding; severe headaches; sudden numbness or weakness of the face, arm or leg; trouble walking; dizziness; loss of coordination Side effects that usually do not require medical attention (report to your doctor or health care professional if they continue or are bothersome): -dizziness -muscle pain This list may not describe all possible side effects. Call your doctor for medical advice about side effects. You may report side effects to FDA at 1-800-FDA-1088. Where should I keep my medicine? Keep out of the reach of children. Store at room temperature between 15 and 30 degrees C (59 and 86 degrees F). Throw away any unused medicine after the expiration date. NOTE: This sheet is a summary. It may not cover all possible information. If you have questions about this medicine, talk to your doctor, pharmacist, or health care provider.  2019 Elsevier/Gold Standard (2017-03-01 11:37:12)  Pulmonary Embolism  A pulmonary embolism (PE) is a sudden blockage or decrease of blood flow in one lung or both lungs. Most blockages come from a blood clot that forms in a lower leg, thigh, or arm vein (deep vein thrombosis, DVT) and travels to the lungs. A clot is blood that has thickened into a gel or solid. PE is a  dangerous and life-threatening condition that needs to be treated right away. What are the causes? This condition is usually caused by a blood clot that forms in a vein and moves to the lungs. In rare cases, it may be caused by air, fat, part of a tumor, or other tissue that moves through the veins and into the lungs. What increases the risk? The following factors may make you more likely to develop this condition:  Traumatic injury,  such as breaking a hip or leg.  Spinal cord injury.  Orthopedic surgery, especially hip or knee replacement.  Any major surgery.  Stroke.  Having DVT.  Blood clots or blood clotting disease.  Long-term (chronic) lung or heart disease.  Taking medicines that contain estrogen. These include birth control pills and hormone replacement therapy.  Cancer and chemotherapy.  Having a central venous catheter.  Pregnancy and the period of time after delivery (postpartum).  Being older than age 57.  Being overweight.  Smoking. What are the signs or symptoms? Symptoms of this condition usually start suddenly and include:  Shortness of breath during activity or at rest.  Coughing or coughing up blood or blood-tinged mucus.  Chest pain that is often worse with deep breaths.  Rapid or irregular heartbeat.  Feeling light-headed or dizzy.  Fainting.  Feeling anxious.  Fever.  Sweating.  Pain and swelling in a leg. This is a symptom of DVT, which can lead to PE. How is this diagnosed? This condition may be diagnosed based on:  Your medical history.  A physical exam.  Blood tests.  CT pulmonary angiogram. This test checks blood flow in and around your lungs.  Ventilation-perfusion scan, also called a lung VQ scan. This test measures air flow and blood flow to the lungs.  Ultrasound of the legs. How is this treated? Treatment for this condition depends on many factors, such as the cause of your PE, your risk for bleeding or developing more clots, and other medical conditions you have. Treatment aims to remove, dissolve, or stop blood clots from forming or growing larger. Treatment may include:  Medicines, such as: ? Blood thinning medicines (anticoagulants) to stop clots from forming or growing. ? Medicines that dissolve clots (thrombolytics).  Procedures, such as: ? Using a flexible tube to remove a blood clot (embolectomy) or deliver medicine to destroy it  (catheter-directed thrombolysis). ? Inserting a filter into a large vein that carries blood to the heart (inferior vena cava). This filter (vena cava filter) catches blood clots before they reach the lungs. ? Surgery to remove the clot (surgical embolectomy). This is rare. You may need a combination of immediate, long-term (up to 3 months after diagnosis), and extended (more than 3 months after diagnosis) treatments. Your treatment may continue for several months (maintenance therapy). You and your health care provider will work together to choose the treatment program that is best for you. Follow these instructions at home: Medicines  Take over-the-counter and prescription medicines only as told by your health care provider.  If you are taking an anticoagulant medicine: ? Take the medicine every day at the same time each day. ? Understand what foods and drugs interact with your medicine. ? Understand the side effects of this medicine, including excessive bruising or bleeding. Ask your health care provider or pharmacist about other side effects. General instructions  Wear a medical alert bracelet or carry a medical alert card that says you have had a PE and lists what medicines you take.  Ask  your health care provider when you may return to your normal activities. Avoid sitting or lying for a long time without moving.  Maintain a healthy weight. Ask your health care provider what weight is healthy for you.  Do not use any products that contain nicotine or tobacco, such as cigarettes and e-cigarettes. If you need help quitting, ask your health care provider.  Talk with your health care provider about any travel plans. It is important to make sure that you are still able to take your medicine while on trips.  Keep all follow-up visits as told by your health care provider. This is important. Contact a health care provider if:  You missed a dose of your blood thinner medicine. Get help right  away if:  You have: ? New or increased pain, swelling, warmth, or redness in an arm or leg. ? Numbness or tingling in an arm or leg. ? Shortness of breath during activity or at rest. ? A fever. ? Chest pain. ? A rapid or irregular heartbeat. ? A severe headache. ? Vision changes. ? A serious fall or accident, or you hit your head. ? Stomach (abdominal) pain. ? Blood in your vomit, stool, or urine. ? A cut that will not stop bleeding.  You cough up blood.  You feel light-headed or dizzy.  You cannot move your arms or legs.  You are confused or have memory loss. These symptoms may represent a serious problem that is an emergency. Do not wait to see if the symptoms will go away. Get medical help right away. Call your local emergency services (911 in the [place] Do not drive yourself to the hospital. Summary  A pulmonary embolism (PE) is a sudden blockage or decrease of blood flow in one lung or both lungs. PE is a dangerous and life-threatening condition that needs to be treated right away.  Treatments for this condition usually include medicines to thin your blood (anticoagulants) or medicines to break apart blood clots (thrombolytics).  If you are given blood thinners, it is important to take the medicine every single day at the same time each day.  If you have signs of PE or DVT, call your local emergency services (911 in the [place] This information is not intended to replace advice given to you by your health care provider. Make sure you discuss any questions you have with your health care provider. Document Released: 03/03/2000 Document Revised: 10/19/2017 Document Reviewed: 04/19/2017 Elsevier Interactive Patient Education  2019 [employer]

## 2018-06-14 NOTE — Progress Notes (Signed)
[facility]  New Patient Encounter Provider: Mike Gip, FNP    URK:[id number]  GBT:[id number]  DOB - May 29, 1975  SUBJECTIVE:   Jeremiah [name], is a 43 y.o. male who presents to establish care with this clinic.   Current problems/concerns:  Recently diagnosed pulmonary embolism on June 07, 2018 after presenting to the ED with chest pain upon taking deep breaths. Patient with previous diagnosis of DVT and PE in 2018. He was started on xarelto at that time. Discontinued use due to not having insurance and running out of medications. He states that he is here to re-establish care in order to get [facility]. He endorses compliance with xarelto. Has not taken narcotic pain medication in 2 days. Patient states that he continues to have right calf discomfort. No DVT of right calf noted on last doppler on 06/10/2018. Recommend testing for blood clotting disorders.    Allergies  Allergen Reactions  . Amoxicillin Anaphylaxis    Did it involve swelling of the face/tongue/throat, SOB, or low BP? Yes Did it involve sudden or severe rash/hives, skin peeling, or any reaction on the inside of your mouth or nose? Yes Did you need to seek medical attention at a hospital or doctor's office? Yes When did it last happen?43 YEARS OLD If all above answers are "NO", may proceed with cephalosporin use.   Marland Kitchen Penicillins Anaphylaxis    Did it involve swelling of the face/tongue/throat, SOB, or low BP? Yes Did it involve sudden or severe rash/hives, skin peeling, or any reaction on the inside of your mouth or nose? Yes Did you need to seek medical attention at a hospital or doctor's office? Yes When did it last happen?CHILDHOOD If all above answers are "NO", may proceed with cephalosporin use.    Past Medical History:  Diagnosis Date  . Anemia   . Asthma    as a child   . Blood clot in vein   . DVT (deep venous thrombosis) (HCC)   .  Pulmonary embolism [facility])    Current Outpatient Medications on File Prior to Visit  Medication Sig Dispense Refill  . cyclobenzaprine (FLEXERIL) 5 MG tablet Take 1-2 tablets (5-10 mg total) by mouth 3 (three) times daily as needed for up to 7 days for muscle spasms. 30 tablet 0  . lidocaine (LIDODERM) 5 % Place 1 patch onto the skin daily. Remove & Discard patch within 12 hours or as directed by MD 30 patch 0  . magnesium citrate SOLN Take 1 Bottle by mouth once.    . morphine (MSIR) 15 MG tablet Take 0.5 tablets (7.5 mg total) by mouth every 8 (eight) hours as needed for up to 7 days for severe pain. 12 tablet 0  . polyethylene glycol (MIRALAX / GLYCOLAX) packet Take 17 g by mouth 2 (two) times daily. 14 each 0  . predniSONE (DELTASONE) 50 MG tablet Take 1 tablet (50 mg total) by mouth daily for 5 days. 5 tablet 0  . Rivaroxaban (XARELTO STARTER PACK) 15 & 20 MG TBPK Take as directed on package: Start with one 15mg  tablet by mouth twice a day with food. On Day 22, switch to one 20mg  tablet once a day with food. 51 each 0  . senna-docusate (SENOKOT-S) 8.6-50 MG tablet Take 2 tablets by mouth 2 (two) times daily for 30 days. 120 tablet 0  . naproxen (NAPROSYN) 500 MG tablet Take 1 tablet (500 mg total) by mouth 2 (two) times daily for  5 days. (Patient not taking: Reported on 06/14/2018) 10 tablet 0   No current facility-administered medications on file prior to visit.    Family History  Problem Relation Age of Onset  . Diabetes Father   . Hypertension Father   . Diabetes Brother   . Hypertension Brother   . Diabetes Paternal Aunt   . Hypertension Paternal Aunt   . Diabetes Paternal Grandmother    Social History   Socioeconomic History  . Marital status: Single    Spouse name: Not on file  . Number of children: Not on file  . Years of education: Not on file  . Highest education level: Not on file  Occupational History  . Not on file  Social Needs  . Financial resource strain: Not on  file  . Food insecurity:    Worry: Not on file    Inability: Not on file  . Transportation needs:    Medical: Not on file    Non-medical: Not on file  Tobacco Use  . Smoking status: Current Every Day Smoker    Packs/day: 0.25    Types: Cigarettes  . Smokeless tobacco: Never Used  Substance and Sexual Activity  . Alcohol use: Yes    Alcohol/week: 15.0 standard drinks    Types: 15 Cans of beer per week  . Drug use: No  . Sexual activity: Not on file  Lifestyle  . Physical activity:    Days per week: Not on file    Minutes per session: Not on file  . Stress: Not on file  Relationships  . Social connections:    Talks on phone: Not on file    Gets together: Not on file    Attends religious service: Not on file    Active member of club or organization: Not on file    Attends meetings of clubs or organizations: Not on file    Relationship status: Not on file  . Intimate partner violence:    Fear of current or ex partner: Not on file    Emotionally abused: Not on file    Physically abused: Not on file    Forced sexual activity: Not on file  Other Topics Concern  . Not on file  Social History Narrative  . Not on file    Review of Systems  Constitutional: Negative.   HENT: Negative.   Eyes: Negative.   Respiratory: Negative.   Cardiovascular: Negative.   Gastrointestinal: Negative.   Genitourinary: Negative.   Musculoskeletal: Positive for joint pain (right calf pain).  Skin: Negative.   Neurological: Negative.   Psychiatric/Behavioral: Negative.      OBJECTIVE:    BP (!) 145/85 (BP Location: Right Arm, Patient Position: Sitting)   Pulse 74   Temp 98.5 F (36.9 C) (Oral)   Physical Exam  Constitutional: He is oriented to person, place, and time and well-developed, well-nourished, and in no distress. No distress.  HENT:  Head: Normocephalic and atraumatic.  Eyes: Pupils are equal, round, and reactive to light. Conjunctivae and EOM are normal.  Neck: Normal  range of motion. Neck supple.  Cardiovascular: Normal rate, regular rhythm and intact distal pulses. Exam reveals no gallop and no friction rub.  No murmur heard. Pulmonary/Chest: Effort normal and breath sounds normal. No respiratory distress. He has no wheezes.  Abdominal: Soft. Bowel sounds are normal. There is no abdominal tenderness.  Musculoskeletal: Normal range of motion.        General: No tenderness or edema.  Lymphadenopathy:  He has no cervical adenopathy.  Neurological: He is alert and oriented to person, place, and time. Gait normal.  Skin: Skin is warm and dry.  Psychiatric: Mood, memory, affect and judgment normal.  Nursing note and vitals reviewed.    ASSESSMENT/PLAN:  1. Establishing care with new doctor, encounter for - POCT URINALYSIS DIP (CLINITEK) - Comprehensive metabolic panel - Lipid Panel  2. Other chronic pulmonary embolism, unspecified whether acute cor pulmonale present (HCC) - CBC with Differential - PT AND PTT - Antithrombin panel - Cardiolipin antibodies, IgG, IgM, IgA - Beta-2 Glycoprotein I Ab,G/M  3. Deep vein thrombosis (DVT) of calf muscle vein of left lower extremity, unspecified chronicity - CBC with Differential - PT AND PTT - Antithrombin panel - Cardiolipin antibodies, IgG, IgM, IgA - Beta-2 Glycoprotein I Ab,G/M   Patient with a history of PE and DVT x 2. He reports compliance with xarelto. Labs order for possible blood clotting disorder. Patient informed on the importance of compliance with medications. He was given instructions for obtaining paperwork for medication assistance.   Return in about 3 months (around 09/14/2018) for Pulmonary embolism.  The patient was given clear instructions to go to ER or return to medical center if symptoms don't improve, worsen or new problems develop. The patient verbalized understanding. The patient was told to call to get lab results if they haven't heard anything in the next week.     This  note has been created with Education officer, environmental. Any transcriptional errors are unintentional.   Ms. Andr L. Riley Lam, FNP-BC [facility] [address] [place], [place] [address] [phone]

## 2018-06-16 LAB — CBC WITH DIFFERENTIAL/PLATELET
Basophils Absolute: 0.1 x10E3/uL (ref 0.0–0.2)
Basos: 1 %
EOS (ABSOLUTE): 0.2 x10E3/uL (ref 0.0–0.4)
Eos: 2 %
Hematocrit: 43.2 % (ref 37.5–51.0)
Hemoglobin: 15.5 g/dL (ref 13.0–17.7)
Immature Grans (Abs): 0 x10E3/uL (ref 0.0–0.1)
Immature Granulocytes: 0 %
Lymphocytes Absolute: 1.5 x10E3/uL (ref 0.7–3.1)
Lymphs: 19 %
MCH: 32 pg (ref 26.6–33.0)
MCHC: 35.9 g/dL — ABNORMAL HIGH (ref 31.5–35.7)
MCV: 89 fL (ref 79–97)
Monocytes Absolute: 0.8 x10E3/uL (ref 0.1–0.9)
Monocytes: 11 %
Neutrophils Absolute: 5.2 x10E3/uL (ref 1.4–7.0)
Neutrophils: 67 %
Platelets: 322 x10E3/uL (ref 150–450)
RBC: 4.84 x10E6/uL (ref 4.14–5.80)
RDW: 12.5 % (ref 11.6–15.4)
WBC: 7.8 x10E3/uL (ref 3.4–10.8)

## 2018-06-16 LAB — COMPREHENSIVE METABOLIC PANEL
ALT: 19 IU/L (ref 0–44)
AST: 26 IU/L (ref 0–40)
Albumin/Globulin Ratio: 1.7 (ref 1.2–2.2)
Albumin: 3.8 g/dL — ABNORMAL LOW (ref 4.0–5.0)
Alkaline Phosphatase: 101 IU/L (ref 39–117)
BUN/Creatinine Ratio: 9 (ref 9–20)
BUN: 12 mg/dL (ref 6–24)
Bilirubin Total: 0.3 mg/dL (ref 0.0–1.2)
CO2: 24 mmol/L (ref 20–29)
Calcium: 9.4 mg/dL (ref 8.7–10.2)
Chloride: 103 mmol/L (ref 96–106)
Creatinine, Ser: 1.37 mg/dL — ABNORMAL HIGH (ref 0.76–1.27)
GFR calc Af Amer: 73 mL/min/{1.73_m2} (ref >59)
GFR calc non Af Amer: 63 mL/min/{1.73_m2} (ref >59)
Globulin, Total: 2.3 g/dL (ref 1.5–4.5)
Glucose: 93 mg/dL (ref 65–99)
Potassium: 4.8 mmol/L (ref 3.5–5.2)
Sodium: 141 mmol/L (ref 134–144)
Total Protein: 6.1 g/dL (ref 6.0–8.5)

## 2018-06-16 LAB — PT AND PTT
INR: 1 (ref 0.8–1.2)
Prothrombin Time: 11.1 s (ref 9.1–12.0)
aPTT: 33 s (ref 24–33)

## 2018-06-16 LAB — BETA-2 GLYCOPROTEIN I AB,G/M
Beta-2 Glyco 1 IgM: <9 GPI IgM units (ref 0–32)
Beta-2 Glyco I IgG: <9 GPI IgG units (ref 0–20)

## 2018-06-16 LAB — ANTITHROMBIN PANEL
AT III AG PPP IMM-ACNC: 97 % (ref 72–124)
AntiThromb III Func: 124 % (ref 75–135)

## 2018-06-16 LAB — CARDIOLIPIN ANTIBODIES, IGG, IGM, IGA
Anticardiolipin IgA: <9 APL U/mL (ref 0–11)
Anticardiolipin IgG: <9 GPL U/mL (ref 0–14)
Anticardiolipin IgM: <9 MPL U/mL (ref 0–12)

## 2018-06-16 LAB — LIPID PANEL
Chol/HDL Ratio: 4.4 ratio (ref 0.0–5.0)
Cholesterol, Total: 210 mg/dL — ABNORMAL HIGH (ref 100–199)
HDL: 48 mg/dL (ref >39)
LDL Calculated: 149 mg/dL — ABNORMAL HIGH (ref 0–99)
Triglycerides: 66 mg/dL (ref 0–149)
VLDL Cholesterol Cal: 13 mg/dL (ref 5–40)

## 2018-06-17 ENCOUNTER — Encounter: Payer: Self-pay | Admitting: Family Medicine

## 2018-09-13 ENCOUNTER — Telehealth: Payer: Self-pay

## 2018-09-13 NOTE — Telephone Encounter (Signed)
Called to do COVID Screening for appointment tomorrow. No answer. Left a message to call back. Thanks! 

## 2018-09-16 ENCOUNTER — Ambulatory Visit: Payer: Self-pay | Admitting: Family Medicine

## 2018-09-25 ENCOUNTER — Ambulatory Visit: Payer: Self-pay | Admitting: Family Medicine

## 2018-09-25 ENCOUNTER — Other Ambulatory Visit: Payer: Self-pay

## 2018-11-01 ENCOUNTER — Emergency Department (HOSPITAL_COMMUNITY)
Admission: EM | Admit: 2018-11-01 | Discharge: 2018-11-02 | Disposition: A | Discharge status: Home / Self Care | Payer: Self-pay | Attending: Emergency Medicine | Admitting: Emergency Medicine

## 2018-11-01 ENCOUNTER — Encounter (HOSPITAL_COMMUNITY): Payer: Self-pay | Admitting: Emergency Medicine

## 2018-11-01 ENCOUNTER — Emergency Department (HOSPITAL_COMMUNITY): Payer: Self-pay

## 2018-11-01 ENCOUNTER — Other Ambulatory Visit: Payer: Self-pay

## 2018-11-01 DIAGNOSIS — F17210 Nicotine dependence, cigarettes, uncomplicated: Secondary | ICD-10-CM | POA: Insufficient documentation

## 2018-11-01 DIAGNOSIS — Z79899 Other long term (current) drug therapy: Secondary | ICD-10-CM | POA: Insufficient documentation

## 2018-11-01 DIAGNOSIS — J9801 Acute bronchospasm: Secondary | ICD-10-CM | POA: Insufficient documentation

## 2018-11-01 DIAGNOSIS — Z20828 Contact with and (suspected) exposure to other viral communicable diseases: Secondary | ICD-10-CM | POA: Insufficient documentation

## 2018-11-01 DIAGNOSIS — R042 Hemoptysis: Secondary | ICD-10-CM | POA: Insufficient documentation

## 2018-11-01 NOTE — ED Triage Notes (Signed)
Patient reports persistent productive cough with chest congestion onset last night , pt. stated he worked at [employer], [employer] ( house) yesterday inhaled dust . Pt. added emesis when coughing .

## 2018-11-01 NOTE — ED Notes (Signed)
PT called Hospitals main line to state he cant breath. This Tech checked patients vitals and assessed condition. PT coughing but breathing, not visibly short of breath. Vitals stable and oxygen 99%. Will continue to monitor.

## 2018-11-02 ENCOUNTER — Emergency Department (HOSPITAL_COMMUNITY): Payer: Self-pay

## 2018-11-02 ENCOUNTER — Encounter (HOSPITAL_COMMUNITY): Payer: Self-pay | Admitting: Radiology

## 2018-11-02 LAB — CBC WITH DIFFERENTIAL/PLATELET
Abs Immature Granulocytes: 0.04 10*3/uL (ref 0.00–0.07)
Basophils Absolute: 0 10*3/uL (ref 0.0–0.1)
Basophils Relative: 0 %
Eosinophils Absolute: 0.1 10*3/uL (ref 0.0–0.5)
Eosinophils Relative: 1 %
HCT: 44.1 % (ref 39.0–52.0)
Hemoglobin: 15.3 g/dL (ref 13.0–17.0)
Immature Granulocytes: 0 %
Lymphocytes Relative: 9 %
Lymphs Abs: 1.1 10*3/uL (ref 0.7–4.0)
MCH: 32.9 pg (ref 26.0–34.0)
MCHC: 34.7 g/dL (ref 30.0–36.0)
MCV: 94.8 fL (ref 80.0–100.0)
Monocytes Absolute: 0.8 10*3/uL (ref 0.1–1.0)
Monocytes Relative: 7 %
Neutro Abs: 10 10*3/uL — ABNORMAL HIGH (ref 1.7–7.7)
Neutrophils Relative %: 83 %
Platelets: 204 10*3/uL (ref 150–400)
RBC: 4.65 MIL/uL (ref 4.22–5.81)
RDW: 13.4 % (ref 11.5–15.5)
WBC: 12.2 10*3/uL — ABNORMAL HIGH (ref 4.0–10.5)
nRBC: 0 % (ref 0.0–0.2)

## 2018-11-02 LAB — BASIC METABOLIC PANEL
Anion gap: 12 (ref 5–15)
BUN: 10 mg/dL (ref 6–20)
CO2: 21 mmol/L — ABNORMAL LOW (ref 22–32)
Calcium: 8.6 mg/dL — ABNORMAL LOW (ref 8.9–10.3)
Chloride: 107 mmol/L (ref 98–111)
Creatinine, Ser: 1.3 mg/dL — ABNORMAL HIGH (ref 0.61–1.24)
GFR calc Af Amer: >60 mL/min (ref >60)
GFR calc non Af Amer: >60 mL/min (ref >60)
Glucose, Bld: 107 mg/dL — ABNORMAL HIGH (ref 70–99)
Potassium: 4 mmol/L (ref 3.5–5.1)
Sodium: 140 mmol/L (ref 135–145)

## 2018-11-02 LAB — SARS CORONAVIRUS 2 BY RT PCR (HOSPITAL ORDER, PERFORMED IN ~~LOC~~ HOSPITAL LAB): SARS Coronavirus 2: NEGATIVE

## 2018-11-02 LAB — TROPONIN I (HIGH SENSITIVITY): Troponin I (High Sensitivity): 5 ng/L (ref <18)

## 2018-11-02 MED ORDER — IOHEXOL 350 MG/ML SOLN
80.0000 mL | Freq: Once | INTRAVENOUS | Status: AC | PRN
  Administered 2018-11-02: 80 mL via INTRAVENOUS

## 2018-11-02 MED ORDER — ALBUTEROL SULFATE (5 MG/ML) 0.5% IN NEBU: 2.5000 mg | INHALATION_SOLUTION | Freq: Four times a day (QID) | RESPIRATORY_TRACT | 12 refills | Status: DC | PRN

## 2018-11-02 MED ORDER — PREDNISONE 20 MG PO TABS: 60.0000 mg | ORAL_TABLET | Freq: Every day | ORAL | 0 refills | Status: DC

## 2018-11-02 MED ORDER — IPRATROPIUM BROMIDE 0.02 % IN SOLN
1.0000 mg | Freq: Once | RESPIRATORY_TRACT | Status: AC
  Administered 2018-11-02: 07:00:00 1 mg via RESPIRATORY_TRACT
  Filled 2018-11-02: qty 5

## 2018-11-02 MED ORDER — SODIUM CHLORIDE 0.9 % IV BOLUS (SEPSIS)
1000.0000 mL | Freq: Once | INTRAVENOUS | Status: AC
  Administered 2018-11-02: 05:00:00 1000 mL via INTRAVENOUS

## 2018-11-02 MED ORDER — ALBUTEROL SULFATE (2.5 MG/3ML) 0.083% IN NEBU
5.0000 mg | INHALATION_SOLUTION | Freq: Once | RESPIRATORY_TRACT | Status: AC
  Administered 2018-11-02: 5 mg via RESPIRATORY_TRACT
  Filled 2018-11-02: qty 6

## 2018-11-02 MED ORDER — METHYLPREDNISOLONE SODIUM SUCC 125 MG IJ SOLR
125.0000 mg | Freq: Once | INTRAMUSCULAR | Status: AC
  Administered 2018-11-02: 125 mg via INTRAVENOUS
  Filled 2018-11-02: qty 2

## 2018-11-02 MED ORDER — ALBUTEROL SULFATE HFA 108 (90 BASE) MCG/ACT IN AERS
8.0000 | INHALATION_SPRAY | Freq: Once | RESPIRATORY_TRACT | Status: DC
  Administered 2018-11-02: 06:00:00 8 via RESPIRATORY_TRACT

## 2018-11-02 MED ORDER — ALBUTEROL SULFATE HFA 108 (90 BASE) MCG/ACT IN AERS
8.0000 | INHALATION_SPRAY | Freq: Once | RESPIRATORY_TRACT | Status: AC
  Administered 2018-11-02: 8 via RESPIRATORY_TRACT
  Filled 2018-11-02: qty 6.7

## 2018-11-02 NOTE — ED Notes (Signed)
Gave pt sandwich and ginger ale to drink

## 2018-11-02 NOTE — Discharge Instructions (Signed)
You may use your albuterol inhaler 2 to 4 puffs every 2-4 hours as needed for shortness of breath and wheezing.  Your COVID test today was negative.

## 2018-11-02 NOTE — ED Provider Notes (Addendum)
TIME SEEN: 4:01 AM  CHIEF COMPLAINT: Cough, shortness of breath, posttussive emesis  HPI: Patient is a 43 year old male with history of recurrent PEs and DVTs, asthma as a child who presents to the emergency department with cough and shortness of breath.  States he was working on a job and there was a lot of dust yesterday.  States this caused him to feel like he is having tightness in his chest and throat and feeling short of breath.  He has coughing spells where he will begin vomiting and then passed out.  He states he is no longer on his Xarelto for his PEs because "I just did not want to take it anymore".  He has had multiple PEs in the past.  No lower extremity swelling or pain.  Denies any known fever or COVID exposures.  No diarrhea.  States he has had some hemoptysis today where he is coughing up streaks of blood.  ROS: See HPI Constitutional: no fever  Eyes: no drainage  ENT: no runny nose   Cardiovascular:   chest pain  Resp:  SOB  GI: no vomiting GU: no dysuria Integumentary: no rash  Allergy: no hives  Musculoskeletal: no leg swelling  Neurological: no slurred speech ROS otherwise negative  PAST MEDICAL HISTORY/PAST SURGICAL HISTORY:  Past Medical History:  Diagnosis Date  . Anemia   . Asthma    as a child   . Blood clot in vein   . DVT (deep venous thrombosis) (HCC)   . Pulmonary embolism (HCC)     MEDICATIONS:  Prior to Admission medications   Medication Sig Start Date End Date Taking? Authorizing Provider  lidocaine (LIDODERM) 5 % Place 1 patch onto the skin daily. Remove & Discard patch within 12 hours or as directed by MD 06/12/18   Purohit, Salli QuarryShrey C, MD  magnesium citrate SOLN Take 1 Bottle by mouth once.    [provider]  polyethylene glycol (MIRALAX / GLYCOLAX) packet Take 17 g by mouth 2 (two) times daily. 06/11/18   Purohit, Salli QuarryShrey C, MD  Rivaroxaban (XARELTO STARTER PACK) 15 & 20 MG TBPK Take as directed on package: Start with one 15mg  tablet by  mouth twice a day with food. On Day 22, switch to one 20mg  tablet once a day with food. 06/07/18   Theotis BarrioLee, Joshua K, MD  rivaroxaban (XARELTO) 20 MG TABS tablet Take 1 tablet (20 mg total) by mouth daily with supper. 06/14/18   Mike Gipouglas, Andre, FNP    ALLERGIES:  Allergies  Allergen Reactions  . Amoxicillin Anaphylaxis    Did it involve swelling of the face/tongue/throat, SOB, or low BP? Yes Did it involve sudden or severe rash/hives, skin peeling, or any reaction on the inside of your mouth or nose? Yes Did you need to seek medical attention at a hospital or doctor's office? Yes When did it last happen?43 YEARS OLD If all above answers are "NO", may proceed with cephalosporin use.   Marland Kitchen. Penicillins Anaphylaxis    Did it involve swelling of the face/tongue/throat, SOB, or low BP? Yes Did it involve sudden or severe rash/hives, skin peeling, or any reaction on the inside of your mouth or nose? Yes Did you need to seek medical attention at a hospital or doctor's office? Yes When did it last happen?CHILDHOOD If all above answers are "NO", may proceed with cephalosporin use.     SOCIAL HISTORY:  Social History   Tobacco Use  . Smoking status: Current Every Day Smoker  Packs/day: 0.25    Types: Cigarettes  . Smokeless tobacco: Never Used  Substance Use Topics  . Alcohol use: Yes    Alcohol/week: 15.0 standard drinks    Types: 15 Cans of beer per week    FAMILY HISTORY: Family History  Problem Relation Age of Onset  . Diabetes Father   . Hypertension Father   . Diabetes Brother   . Hypertension Brother   . Diabetes Paternal Aunt   . Hypertension Paternal Aunt   . Diabetes Paternal Grandmother     EXAM: BP 127/82   Pulse 82   Temp 99.7 F (37.6 C) (Oral)   Resp (!) 22   SpO2 97%  CONSTITUTIONAL: Alert and oriented and responds appropriately to questions. Well-appearing; well-nourished HEAD: Normocephalic EYES: Conjunctivae clear, pupils appear equal,  EOMI ENT: normal nose; moist mucous membranes NECK: Supple, no meningismus, no nuchal rigidity, no LAD  CARD: RRR; S1 and S2 appreciated; no murmurs, no clicks, no rubs, no gallops RESP: Normal chest excursion without splinting, mildly tachypneic, scattered inspiratory and expiratory wheezes, diminished at bases bilaterally, no rhonchi or rales, no hypoxia respiratory distress, speaking full sentences ABD/GI: Normal bowel sounds; non-distended; soft, non-tender, no rebound, no guarding, no peritoneal signs, no hepatosplenomegaly BACK:  The back appears normal and is non-tender to palpation, there is no CVA tenderness EXT: Normal ROM in all joints; non-tender to palpation; no edema; normal capillary refill; no cyanosis, no calf tenderness or swelling    SKIN: Normal color for age and race; warm; no rash NEURO: Moves all extremities equally PSYCH: The patient's mood and manner are appropriate. Grooming and personal hygiene are appropriate.  MEDICAL DECISION MAKING: Patient here with cough, shortness of breath, wheezing.  Differential includes asthma, bronchitis, COVID-19, PE.  Less likely ACS but he does state he feels tight in his chest.  Chest x-ray was clear.  Will obtain labs, EKG and a CT of his chest.  Discussed with patient that I am concerned about PE given his shortness of breath, hemoptysis given his history of multiple clots and now noncompliance with his anticoagulation.  Will obtain COVID swab.  Will give albuterol inhaler and steroids.  ED PROGRESS: 6:25 AM  Patient's labs show mild leukocytosis.  Creatinine mildly elevated at 1.3.  This appears chronic.  He is getting IV fluids prior to his CTA.  He is COVID negative.  Troponin negative.  I do not feel he needs serial enzymes.  Will give DuoNeb for symptomatic relief.  CT of the chest pending.  Signed out to oncoming ED physician.  EKG Interpretation  Date/Time:  Saturday November 02 2018 04:58:09 EDT Ventricular Rate:  93 PR  Interval:    QRS Duration: 92 QT Interval:  359 QTC Calculation: 447 R Axis:   88 Text Interpretation:  Sinus rhythm Borderline T wave abnormalities No significant change since last tracing other than rate is slower Confirmed by Rochele RaringWard, Kristen [phone]([id number]) on 11/02/2018 5:02:50 AM        Jeremiah [name] in Emergency Department on 11/02/2018 for the symptoms described in the history of present illness. He was [name] in the context of the global COVID-19 pandemic, which necessitated consideration that the patient might be at risk for infection with the SARS-CoV-2 virus that causes COVID-19. Institutional protocols and algorithms that pertain to the evaluation of patients at risk for COVID-19 are in a state of rapid change based on information released by regulatory bodies including the CDC and federal and state organizations. These  policies and algorithms were followed during the patient's care in the ED.     Ward, Delice Bison, DO 11/02/18 0719    Ward, Delice Bison, DO 11/02/18 6812

## 2018-11-02 NOTE — ED Notes (Addendum)
Pt states that he has been coughing up blood and had episodes of passing earlier today.

## 2018-11-02 NOTE — ED Provider Notes (Signed)
Patient handed off to me at 7 AM with patient pending CT PE study.  Patient with possible bronchospasm.  Treated with albuterol and steroids.  History of PE but no longer on anticoagulation.  Stop Xarelto on his own.  Did not like being on blood thinners.  Has gotten better with breathing treatments.  Awaiting CT scan to evaluate for PE.  Patient with CT scan that showed no PE.  Some nonspecific inflammatory changes.  Patient felt better after breathing treatments.  Will discharge with further albuterol treatments and steroids.  Given information to follow-up with primary care doctor.  Likely bronchospasm in the setting of allergies.  This chart was dictated using voice recognition software.  Despite best efforts to proofread,  errors can occur which can change the documentation meaning.     Lennice Sites, DO 11/02/18 1029

## 2018-11-02 NOTE — ED Notes (Signed)
Pt placed on 2L [place] for comfort during a coughing spell he developed. 95% on RA

## 2018-11-27 ENCOUNTER — Encounter (HOSPITAL_COMMUNITY): Payer: Self-pay

## 2018-11-27 ENCOUNTER — Encounter (HOSPITAL_COMMUNITY): Payer: Self-pay | Admitting: *Deleted

## 2019-01-07 ENCOUNTER — Inpatient Hospital Stay (HOSPITAL_COMMUNITY)
Admission: EM | Admit: 2019-01-07 | Discharge: 2019-01-09 | DRG: 287 | Disposition: A | Discharge status: Home / Self Care | Payer: Self-pay | Attending: Cardiology | Admitting: Cardiology

## 2019-01-07 ENCOUNTER — Encounter (HOSPITAL_COMMUNITY)
Admission: EM | Disposition: A | Discharge status: Home / Self Care | Payer: Self-pay | Source: Home / Self Care | Attending: Cardiology

## 2019-01-07 DIAGNOSIS — Z880 Allergy status to penicillin: Secondary | ICD-10-CM

## 2019-01-07 DIAGNOSIS — F17210 Nicotine dependence, cigarettes, uncomplicated: Secondary | ICD-10-CM | POA: Diagnosis present

## 2019-01-07 DIAGNOSIS — J45909 Unspecified asthma, uncomplicated: Secondary | ICD-10-CM | POA: Diagnosis present

## 2019-01-07 DIAGNOSIS — Z9119 Patient's noncompliance with other medical treatment and regimen: Secondary | ICD-10-CM

## 2019-01-07 DIAGNOSIS — Z7952 Long term (current) use of systemic steroids: Secondary | ICD-10-CM

## 2019-01-07 DIAGNOSIS — I249 Acute ischemic heart disease, unspecified: Principal | ICD-10-CM | POA: Diagnosis present

## 2019-01-07 DIAGNOSIS — Z20828 Contact with and (suspected) exposure to other viral communicable diseases: Secondary | ICD-10-CM | POA: Diagnosis present

## 2019-01-07 DIAGNOSIS — I2102 ST elevation (STEMI) myocardial infarction involving left anterior descending coronary artery: Secondary | ICD-10-CM | POA: Diagnosis present

## 2019-01-07 DIAGNOSIS — Z86711 Personal history of pulmonary embolism: Secondary | ICD-10-CM

## 2019-01-07 DIAGNOSIS — I213 ST elevation (STEMI) myocardial infarction of unspecified site: Secondary | ICD-10-CM | POA: Diagnosis present

## 2019-01-07 DIAGNOSIS — Z86718 Personal history of other venous thrombosis and embolism: Secondary | ICD-10-CM

## 2019-01-07 DIAGNOSIS — Z7901 Long term (current) use of anticoagulants: Secondary | ICD-10-CM

## 2019-01-07 DIAGNOSIS — Z881 Allergy status to other antibiotic agents status: Secondary | ICD-10-CM

## 2019-01-07 DIAGNOSIS — Z79899 Other long term (current) drug therapy: Secondary | ICD-10-CM

## 2019-01-07 DIAGNOSIS — I252 Old myocardial infarction: Secondary | ICD-10-CM

## 2019-01-07 HISTORY — PX: LEFT HEART CATH AND CORONARY ANGIOGRAPHY: CATH118249

## 2019-01-07 LAB — SARS CORONAVIRUS 2 BY RT PCR (HOSPITAL ORDER, PERFORMED IN ~~LOC~~ HOSPITAL LAB): SARS Coronavirus 2: NEGATIVE

## 2019-01-07 LAB — COMPREHENSIVE METABOLIC PANEL
ALT: 21 U/L (ref 0–44)
AST: 23 U/L (ref 15–41)
Albumin: 3.1 g/dL — ABNORMAL LOW (ref 3.5–5.0)
Alkaline Phosphatase: 56 U/L (ref 38–126)
Anion gap: 11 (ref 5–15)
BUN: 12 mg/dL (ref 6–20)
CO2: 21 mmol/L — ABNORMAL LOW (ref 22–32)
Calcium: 8.3 mg/dL — ABNORMAL LOW (ref 8.9–10.3)
Chloride: 108 mmol/L (ref 98–111)
Creatinine, Ser: 1.5 mg/dL — ABNORMAL HIGH (ref 0.61–1.24)
GFR calc Af Amer: >60 mL/min (ref >60)
GFR calc non Af Amer: 56 mL/min — ABNORMAL LOW (ref >60)
Glucose, Bld: 141 mg/dL — ABNORMAL HIGH (ref 70–99)
Potassium: 3.7 mmol/L (ref 3.5–5.1)
Sodium: 140 mmol/L (ref 135–145)
Total Bilirubin: 1.2 mg/dL (ref 0.3–1.2)
Total Protein: 4.9 g/dL — ABNORMAL LOW (ref 6.5–8.1)

## 2019-01-07 LAB — PROTIME-INR
INR: 1.2 (ref 0.8–1.2)
Prothrombin Time: 15.3 seconds — ABNORMAL HIGH (ref 11.4–15.2)

## 2019-01-07 LAB — POCT I-STAT, CHEM 8
BUN: 13 mg/dL (ref 6–20)
Calcium, Ion: 1.21 mmol/L (ref 1.15–1.40)
Chloride: 104 mmol/L (ref 98–111)
Creatinine, Ser: 1.5 mg/dL — ABNORMAL HIGH (ref 0.61–1.24)
Glucose, Bld: 145 mg/dL — ABNORMAL HIGH (ref 70–99)
HCT: 51 % (ref 39.0–52.0)
Hemoglobin: 17.3 g/dL — ABNORMAL HIGH (ref 13.0–17.0)
Potassium: 3.7 mmol/L (ref 3.5–5.1)
Sodium: 141 mmol/L (ref 135–145)
TCO2: 23 mmol/L (ref 22–32)

## 2019-01-07 LAB — D-DIMER, QUANTITATIVE: D-Dimer, Quant: 0.5 ug/mL-FEU (ref 0.00–0.50)

## 2019-01-07 LAB — APTT: aPTT: >200 seconds (ref 24–36)

## 2019-01-07 LAB — HEPARIN LEVEL (UNFRACTIONATED): Heparin Unfractionated: 0.31 IU/mL (ref 0.30–0.70)

## 2019-01-07 LAB — TROPONIN I (HIGH SENSITIVITY)
Troponin I (High Sensitivity): 4 ng/L (ref <18)
Troponin I (High Sensitivity): 54 ng/L — ABNORMAL HIGH (ref <18)

## 2019-01-07 SURGERY — LEFT HEART CATH AND CORONARY ANGIOGRAPHY
Anesthesia: LOCAL

## 2019-01-07 MED ORDER — TIROFIBAN HCL IN NACL 5-0.9 MG/100ML-% IV SOLN
INTRAVENOUS | Status: AC | PRN
  Administered 2019-01-07: 0.15 ug/kg/min via INTRAVENOUS

## 2019-01-07 MED ORDER — CLOPIDOGREL BISULFATE 75 MG PO TABS: 75.0000 mg | ORAL_TABLET | Freq: Every day | ORAL | Status: DC

## 2019-01-07 MED ORDER — ASPIRIN 81 MG PO CHEW: 324.0000 mg | CHEWABLE_TABLET | ORAL | Status: DC

## 2019-01-07 MED ORDER — HEPARIN SODIUM (PORCINE) 1000 UNIT/ML IJ SOLN
INTRAMUSCULAR | Status: DC | PRN
  Administered 2019-01-07: 4000 [IU] via INTRAVENOUS

## 2019-01-07 MED ORDER — MORPHINE SULFATE (PF) 2 MG/ML IV SOLN
2.0000 mg | Freq: Once | INTRAVENOUS | Status: AC
  Administered 2019-01-07: 2 mg via INTRAVENOUS
  Filled 2019-01-07: qty 1

## 2019-01-07 MED ORDER — SODIUM CHLORIDE 0.9 % IV SOLN: 250.0000 mL | INTRAVENOUS | Status: DC | PRN

## 2019-01-07 MED ORDER — LABETALOL HCL 5 MG/ML IV SOLN: 10.0000 mg | INTRAVENOUS | Status: AC | PRN

## 2019-01-07 MED ORDER — HEPARIN SODIUM (PORCINE) 1000 UNIT/ML IJ SOLN
INTRAMUSCULAR | Status: AC
  Filled 2019-01-07: qty 1

## 2019-01-07 MED ORDER — ACETAMINOPHEN 325 MG PO TABS: 650.0000 mg | ORAL_TABLET | ORAL | Status: DC | PRN

## 2019-01-07 MED ORDER — NITROGLYCERIN 0.4 MG SL SUBL: 0.4000 mg | SUBLINGUAL_TABLET | SUBLINGUAL | Status: DC | PRN

## 2019-01-07 MED ORDER — ASPIRIN 300 MG RE SUPP: 300.0000 mg | RECTAL | Status: DC

## 2019-01-07 MED ORDER — CLOPIDOGREL BISULFATE 75 MG PO TABS
600.0000 mg | ORAL_TABLET | Freq: Once | ORAL | Status: AC
  Administered 2019-01-07: 18:00:00 600 mg via ORAL
  Filled 2019-01-07: qty 8

## 2019-01-07 MED ORDER — HEPARIN (PORCINE) IN NACL 1000-0.9 UT/500ML-% IV SOLN
INTRAVENOUS | Status: DC | PRN
  Administered 2019-01-07 (×2): 500 mL

## 2019-01-07 MED ORDER — SODIUM CHLORIDE 0.9% FLUSH
3.0000 mL | Freq: Two times a day (BID) | INTRAVENOUS | Status: DC
  Administered 2019-01-07 – 2019-01-09 (×4): 3 mL via INTRAVENOUS

## 2019-01-07 MED ORDER — LIDOCAINE HCL (PF) 1 % IJ SOLN
INTRAMUSCULAR | Status: AC
  Filled 2019-01-07: qty 30

## 2019-01-07 MED ORDER — TIROFIBAN HCL IN NACL 5-0.9 MG/100ML-% IV SOLN
0.1500 ug/kg/min | INTRAVENOUS | Status: DC
  Administered 2019-01-07 – 2019-01-08 (×3): 0.15 ug/kg/min via INTRAVENOUS
  Filled 2019-01-07 (×4): qty 100

## 2019-01-07 MED ORDER — TIROFIBAN HCL IN NACL 5-0.9 MG/100ML-% IV SOLN
INTRAVENOUS | Status: AC
  Filled 2019-01-07: qty 200

## 2019-01-07 MED ORDER — ASPIRIN EC 81 MG PO TBEC: 81.0000 mg | DELAYED_RELEASE_TABLET | Freq: Every day | ORAL | Status: DC

## 2019-01-07 MED ORDER — BIVALIRUDIN BOLUS VIA INFUSION - CUPID
INTRAVENOUS | Status: DC | PRN
  Administered 2019-01-07: 64.65 mg via INTRAVENOUS

## 2019-01-07 MED ORDER — ONDANSETRON HCL 4 MG/2ML IJ SOLN: 4.0000 mg | Freq: Four times a day (QID) | INTRAMUSCULAR | Status: DC | PRN

## 2019-01-07 MED ORDER — HYDRALAZINE HCL 20 MG/ML IJ SOLN: 10.0000 mg | INTRAMUSCULAR | Status: AC | PRN

## 2019-01-07 MED ORDER — MORPHINE SULFATE (PF) 2 MG/ML IV SOLN
2.0000 mg | INTRAVENOUS | Status: DC | PRN
  Administered 2019-01-07 – 2019-01-08 (×2): 2 mg via INTRAVENOUS
  Filled 2019-01-07 (×2): qty 1

## 2019-01-07 MED ORDER — IOHEXOL 350 MG/ML SOLN
INTRAVENOUS | Status: DC | PRN
  Administered 2019-01-07: 20 mL

## 2019-01-07 MED ORDER — LIDOCAINE 5 % EX PTCH
1.0000 | MEDICATED_PATCH | CUTANEOUS | Status: DC
  Administered 2019-01-07: 18:00:00 1 via TRANSDERMAL
  Filled 2019-01-07 (×2): qty 1

## 2019-01-07 MED ORDER — TRAZODONE HCL 50 MG PO TABS
50.0000 mg | ORAL_TABLET | Freq: Every day | ORAL | Status: DC
  Administered 2019-01-07 – 2019-01-08 (×2): 50 mg via ORAL
  Filled 2019-01-07 (×2): qty 1

## 2019-01-07 MED ORDER — ATORVASTATIN CALCIUM 80 MG PO TABS
80.0000 mg | ORAL_TABLET | Freq: Every day | ORAL | Status: DC
  Administered 2019-01-07 – 2019-01-08 (×2): 80 mg via ORAL
  Filled 2019-01-07 (×2): qty 1

## 2019-01-07 MED ORDER — LIDOCAINE HCL (PF) 1 % IJ SOLN
INTRAMUSCULAR | Status: DC | PRN
  Administered 2019-01-07: 2 mL

## 2019-01-07 MED ORDER — ALBUTEROL SULFATE (2.5 MG/3ML) 0.083% IN NEBU: 2.5000 mg | INHALATION_SOLUTION | Freq: Four times a day (QID) | RESPIRATORY_TRACT | Status: DC | PRN

## 2019-01-07 MED ORDER — HEPARIN (PORCINE) 25000 UT/250ML-% IV SOLN
950.0000 [IU]/h | INTRAVENOUS | Status: DC
  Administered 2019-01-07: 18:00:00 850 [IU]/h via INTRAVENOUS
  Filled 2019-01-07: qty 250

## 2019-01-07 MED ORDER — TIROFIBAN HCL IN NACL 5-0.9 MG/100ML-% IV SOLN
INTRAVENOUS | Status: DC | PRN
  Administered 2019-01-07: 0.15 ug/kg/min via INTRAVENOUS

## 2019-01-07 MED ORDER — SODIUM CHLORIDE 0.9 % IV SOLN
INTRAVENOUS | Status: AC
  Administered 2019-01-07: 18:00:00 via INTRAVENOUS

## 2019-01-07 MED ORDER — HEPARIN (PORCINE) IN NACL 1000-0.9 UT/500ML-% IV SOLN
INTRAVENOUS | Status: AC
  Filled 2019-01-07: qty 1000

## 2019-01-07 MED ORDER — VERAPAMIL HCL 2.5 MG/ML IV SOLN
INTRAVENOUS | Status: DC | PRN
  Administered 2019-01-07: 10 mL via INTRA_ARTERIAL

## 2019-01-07 MED ORDER — METOPROLOL TARTRATE 25 MG PO TABS
25.0000 mg | ORAL_TABLET | Freq: Two times a day (BID) | ORAL | Status: DC
  Administered 2019-01-07 – 2019-01-09 (×4): 25 mg via ORAL
  Filled 2019-01-07 (×4): qty 1

## 2019-01-07 MED ORDER — NITROGLYCERIN IN D5W 200-5 MCG/ML-% IV SOLN
INTRAVENOUS | Status: AC
  Administered 2019-01-07: 20 mg
  Filled 2019-01-07: qty 250

## 2019-01-07 MED ORDER — RIVAROXABAN 20 MG PO TABS: 20.0000 mg | ORAL_TABLET | Freq: Every day | ORAL | Status: DC

## 2019-01-07 MED ORDER — SODIUM CHLORIDE 0.9% FLUSH: 3.0000 mL | INTRAVENOUS | Status: DC | PRN

## 2019-01-07 MED ORDER — NITROGLYCERIN IN D5W 200-5 MCG/ML-% IV SOLN
2.0000 ug/min | INTRAVENOUS | Status: DC
  Administered 2019-01-07: 17:00:00 20 ug/min via INTRAVENOUS

## 2019-01-07 MED ORDER — VERAPAMIL HCL 2.5 MG/ML IV SOLN
INTRAVENOUS | Status: AC
  Filled 2019-01-07: qty 2

## 2019-01-07 SURGICAL SUPPLY — 12 items
CATH OPTITORQUE TIG 4.0 5F (CATHETERS) ×1 IMPLANT
DEVICE RAD COMP TR BAND LRG (VASCULAR PRODUCTS) ×1 IMPLANT
ELECT DEFIB PAD ADLT CADENCE (PAD) ×1 IMPLANT
GLIDESHEATH SLEND A-KIT 6F 22G (SHEATH) ×1 IMPLANT
GUIDEWIRE INQWIRE 1.5J.035X260 (WIRE) IMPLANT
INQWIRE 1.5J .035X260CM (WIRE) ×2
KIT ENCORE 26 ADVANTAGE (KITS) ×1 IMPLANT
KIT HEART LEFT (KITS) ×2 IMPLANT
KIT HEMO VALVE WATCHDOG (MISCELLANEOUS) ×1 IMPLANT
PACK CARDIAC CATHETERIZATION (CUSTOM PROCEDURE TRAY) ×2 IMPLANT
TRANSDUCER W/STOPCOCK (MISCELLANEOUS) ×2 IMPLANT
TUBING CIL FLEX 10 FLL-RA (TUBING) ×2 IMPLANT

## 2019-01-07 NOTE — Progress Notes (Addendum)
ANTICOAGULATION CONSULT NOTE - Follow Up Consult  Pharmacy Consult for heparin Indication: concern for spontaeous coronary thrombus  Labs: Recent Labs    01/07/19 1524 01/07/19 1705 01/07/19 2307  HGB 17.3*  --   --   HCT 51.0  --   --   APTT >200*  --   --   LABPROT 15.3*  --   --   INR 1.2  --   --   HEPARINUNFRC  --   --  0.31  CREATININE 1.50*  1.50*  --   --   TROPONINIHS 4 54*  --     Assessment/Plan:  43yo male therapeutic on heparin with initial dosing post-cath. Will continue gtt at current rate and confirm stable with additional level.   Wynona Neat, PharmD, BCPS  01/07/2019,11:54 PM   Addendum: Heparin level this am 0.25, below goal.  Will increase heparin gtt by ~1 unit/kg/hr to 950 units/hr and check level in 6 hours.  VB 01/08/2019 5:07 AM

## 2019-01-07 NOTE — Progress Notes (Signed)
Responded to code Stemi.  Patient going straight to Cath Lab.  No family. Supported Biochemist, clinical.  Will follow as needed.   Jaclynn Major, [place], [facility], Pager [phone]

## 2019-01-07 NOTE — H&P (Addendum)
Jeremiah [name] is an 43 y.o. male.   Chief Complaint: Chest pain HPI:   43 year old African-American American male with history of unprovoked PE in 2018, history of substance abuse, brought to ED with chest pain and anterolateral ST elevation.  Chest pain started around 1 PM, described as squeezing, pressure-like.  Patient had just completed plasma donation around noon, and had 2 alcoholic drinks, but denies usage of cocaine.  Given ongoing ST elevation, he was emergently take to cath lab.  Coronary angiography showed normal coronary arteries without any stenosis, but a filling defect in ostial LAD suggestive of thrombus.  No intervention was performed, and patient was started on heparin and Aggrastat.  Repeat EKG while in Cath Lab showed complete resolution of ST elevation.  At baseline, patient is a smoker, has been noncompliant with his Xarelto due to cost issues.  He has used cocaine in the past, but denies any recent use.  Past Medical History:  Diagnosis Date  . Anemia   . Asthma    as a child   . Blood clot in vein   . DVT (deep venous thrombosis) (HCC)   . Pulmonary embolism (HCC)     No past surgical history on file.  Family History  Problem Relation Age of Onset  . Diabetes Father   . Hypertension Father   . Diabetes Brother   . Hypertension Brother   . Diabetes Paternal Aunt   . Hypertension Paternal Aunt   . Diabetes Paternal Grandmother    Social History:  reports that he has been smoking cigarettes. He has been smoking about 0.25 packs per day. He has never used smokeless tobacco. He reports current alcohol use of about 15.0 standard drinks of alcohol per week. He reports that he does not use drugs.  Allergies:  Allergies  Allergen Reactions  . Amoxicillin Anaphylaxis    Did it involve swelling of the face/tongue/throat, SOB, or low BP? Yes Did it involve sudden or severe rash/hives, skin peeling, or any reaction on the inside of your mouth or nose? Yes Did you need  to seek medical attention at a hospital or doctor's office? Yes When did it last happen?43 YEARS OLD If all above answers are "NO", may proceed with cephalosporin use.   Marland Kitchen Penicillins Anaphylaxis    Did it involve swelling of the face/tongue/throat, SOB, or low BP? Yes Did it involve sudden or severe rash/hives, skin peeling, or any reaction on the inside of your mouth or nose? Yes Did you need to seek medical attention at a hospital or doctor's office? Yes When did it last happen?CHILDHOOD If all above answers are "NO", may proceed with cephalosporin use.     Review of Systems  Constitution: Negative for decreased appetite, malaise/fatigue, weight gain and weight loss.  HENT: Negative for congestion.   Eyes: Negative for visual disturbance.  Cardiovascular: Positive for chest pain. Negative for dyspnea on exertion, leg swelling, palpitations and syncope.  Respiratory: Negative for cough.   Endocrine: Negative for cold intolerance.  Hematologic/Lymphatic: Does not bruise/bleed easily.  Skin: Negative for itching and rash.  Musculoskeletal: Negative for myalgias.  Gastrointestinal: Negative for abdominal pain, nausea and vomiting.  Genitourinary: Negative for dysuria.  Neurological: Negative for dizziness and weakness.  Psychiatric/Behavioral: The patient is not nervous/anxious.   All other systems reviewed and are negative.    Blood pressure (!) 153/94, pulse 68, temperature 98.7 F (37.1 C), temperature source Oral, resp. rate 19, height  (1.753 m), weight 86.2 kg,  SpO2 100 %. Body mass index is 28.06 kg/m.  Physical Exam  Constitutional: He is oriented to person, place, and time. He appears well-developed and well-nourished. No distress.  HENT:  Head: Normocephalic and atraumatic.  Eyes: Pupils are equal, round, and reactive to light. Conjunctivae are normal.  Neck: No JVD present.  Cardiovascular: Normal rate, regular rhythm and intact distal pulses.  No  murmur heard. Pulmonary/Chest: Effort normal and breath sounds normal. He has no wheezes. He has no rales.  Abdominal: Soft. Bowel sounds are normal. There is no rebound.  Musculoskeletal:        General: No edema.  Lymphadenopathy:    He has no cervical adenopathy.  Neurological: He is alert and oriented to person, place, and time. No cranial nerve deficit.  Skin: Skin is warm and dry.  Psychiatric: He has a normal mood and affect.  Nursing note and vitals reviewed.   Results for orders placed or performed during the hospital encounter of 01/07/19 (from the past 48 hour(s))  SARS Coronavirus 2 by RT PCR (hospital order, performed in [facility] hospital lab) Nasopharyngeal Nasopharyngeal Swab     Status: None   Collection Time: 01/07/19  3:10 PM   Specimen: Nasopharyngeal Swab  Result Value Ref Range   SARS Coronavirus 2 NEGATIVE NEGATIVE    Comment: (NOTE) If result is NEGATIVE SARS-CoV-2 target nucleic acids are NOT DETECTED. The SARS-CoV-2 RNA is generally detectable in upper and lower  respiratory specimens during the acute phase of infection. The lowest  concentration of SARS-CoV-2 viral copies this assay can detect is 250  copies / mL. A negative result does not preclude SARS-CoV-2 infection  and should not be used as the sole basis for treatment or other  patient management decisions.  A negative result may occur with  improper specimen collection / handling, submission of specimen other  than nasopharyngeal swab, presence of viral mutation(s) within the  areas targeted by this assay, and inadequate number of viral copies  (<250 copies / mL). A negative result must be combined with clinical  observations, patient history, and epidemiological information. If result is POSITIVE SARS-CoV-2 target nucleic acids are DETECTED. The SARS-CoV-2 RNA is generally detectable in upper and lower  respiratory specimens dur ing the acute phase of infection.  Positive  results are  indicative of active infection with SARS-CoV-2.  Clinical  correlation with patient history and other diagnostic information is  necessary to determine patient infection status.  Positive results do  not rule out bacterial infection or co-infection with other viruses. If result is PRESUMPTIVE POSTIVE SARS-CoV-2 nucleic acids MAY BE PRESENT.   A presumptive positive result was obtained on the submitted specimen  and confirmed on repeat testing.  While 2019 novel coronavirus  (SARS-CoV-2) nucleic acids may be present in the submitted sample  additional confirmatory testing may be necessary for epidemiological  and / or clinical management purposes  to differentiate between  SARS-CoV-2 and other Sarbecovirus currently known to infect humans.  If clinically indicated additional testing with an alternate test  methodology [phone]) is advised. The SARS-CoV-2 RNA is generally  detectable in upper and lower respiratory sp ecimens during the acute  phase of infection. The expected result is Negative. Fact Sheet for Patients:  BoilerBrush.com.cy Fact Sheet for Healthcare Providers: https://pope.com/ This test is not yet approved or cleared by the [place] FDA and has been authorized for detection and/or diagnosis of SARS-CoV-2 by FDA under an Emergency Use Authorization (EUA).  This EUA will  remain in effect (meaning this test can be used) for the duration of the COVID-19 declaration under Section 564(b)(1) of the Act, 21 U.S.C. section 360bbb-3(b)(1), unless the authorization is terminated or revoked sooner. Performed at [facility], [address] [place], [place] [address]   Comprehensive metabolic panel     Status: Abnormal   Collection Time: 01/07/19  3:24 PM  Result Value Ref Range   Sodium 140 135 - 145 mmol/L   Potassium 3.7 3.5 - 5.1 mmol/L   Chloride 108 98 - 111 mmol/L   CO2 21 (L) 22 - 32 mmol/L   Glucose, Bld 141  (H) 70 - 99 mg/dL   BUN 12 6 - 20 mg/dL   Creatinine, Ser 6.041.50 (H) 0.61 - 1.24 mg/dL   Calcium 8.3 (L) 8.9 - 10.3 mg/dL   Total Protein 4.9 (L) 6.5 - 8.1 g/dL   Albumin 3.1 (L) 3.5 - 5.0 g/dL   AST 23 15 - 41 U/L   ALT 21 0 - 44 U/L   Alkaline Phosphatase 56 38 - 126 U/L   Total Bilirubin 1.2 0.3 - 1.2 mg/dL   GFR calc non Af Amer 56 (L) >60 mL/min   GFR calc Af Amer >60 >60 mL/min   Anion gap 11 5 - 15    Comment: Performed at [facility], [address] [place], [place] [address]  APTT     Status: Abnormal   Collection Time: 01/07/19  3:24 PM  Result Value Ref Range   aPTT >200 (HH) 24 - 36 seconds    Comment:        IF BASELINE aPTT IS ELEVATED, SUGGEST PATIENT RISK ASSESSMENT BE USED TO DETERMINE APPROPRIATE ANTICOAGULANT THERAPY. REPEATED TO VERIFY CRITICAL RESULT CALLED TO, READ BACK BY AND VERIFIED WITH: L COX RN AT 1717 ON [id number] BY K FORSYTH Performed at [facility], [address] [place], [place] [address]   Protime-INR     Status: Abnormal   Collection Time: 01/07/19  3:24 PM  Result Value Ref Range   Prothrombin Time 15.3 (H) 11.4 - 15.2 seconds   INR 1.2 0.8 - 1.2    Comment: (NOTE) INR goal varies based on device and disease states. Performed at [facility], [address] [place], [place] [address]   Troponin I (High Sensitivity)     Status: None   Collection Time: 01/07/19  3:24 PM  Result Value Ref Range   Troponin I (High Sensitivity) 4 <18 ng/L    Comment: (NOTE) Elevated high sensitivity troponin I (hsTnI) values and significant  changes across serial measurements may suggest ACS but many other  chronic and acute conditions are known to elevate hsTnI results.  Refer to the "Links" section for chest pain algorithms and additional  guidance. Performed at [facility], [address] [place], [place] [address]   D-dimer, quantitative (not at [facility])     Status: None   Collection Time: 01/07/19  3:24 PM  Result Value  Ref Range   D-Dimer, Quant 0.50 0.00 - 0.50 ug/mL-FEU    Comment: (NOTE) At the manufacturer cut-off of 0.50 ug/mL FEU, this assay has been documented to exclude PE with a sensitivity and negative predictive value of 97 to 99%.  At this time, this assay has not been approved by the FDA to exclude DVT/VTE. Results should be correlated with clinical presentation. Performed at [facility], [address] [place], [place] [address]  CORRECTED ON 10/20 AT 1718: PREVIOUSLY REPORTED AS 0.50   I-STAT, chem 8     Status: Abnormal   Collection Time: 01/07/19  3:24 PM  Result Value Ref Range   Sodium 141 135 - 145 mmol/L   Potassium 3.7 3.5 - 5.1 mmol/L   Chloride 104 98 - 111 mmol/L   BUN 13 6 - 20 mg/dL   Creatinine, Ser 1.61 (H) 0.61 - 1.24 mg/dL   Glucose, Bld 096 (H) 70 - 99 mg/dL   Calcium, Ion 0.45 4.09 - 1.40 mmol/L   TCO2 23 22 - 32 mmol/L   Hemoglobin 17.3 (H) 13.0 - 17.0 g/dL   HCT 81.1 91.4 - 78.2 %  Troponin I (High Sensitivity)     Status: Abnormal   Collection Time: 01/07/19  5:05 PM  Result Value Ref Range   Troponin I (High Sensitivity) 54 (H) <18 ng/L    Comment: RESULT CALLED TO, READ BACK BY AND VERIFIED WITH: L.COX RN 1812 01/07/2019 MCCORMICK K (NOTE) Elevated high sensitivity troponin I (hsTnI) values and significant  changes across serial measurements may suggest ACS but many other  chronic and acute conditions are known to elevate hsTnI results.  Refer to the Links section for chest pain algorithms and additional  guidance. Performed at [facility], [address], [place], [place] [address]     Labs:   Lab Results  Component Value Date   WBC 12.2 (H) 11/02/2018   HGB 17.3 (H) 01/07/2019   HCT 51.0 01/07/2019   MCV 94.8 11/02/2018   PLT 204 11/02/2018    Recent Labs  Lab 01/07/19 1524  NA 141  140  K 3.7  3.7  CL 104  108  CO2 21*  BUN 13  12  CREATININE 1.50*  1.50*  CALCIUM 8.3*  PROT 4.9*  BILITOT 1.2  ALKPHOS 56   ALT 21  AST 23  GLUCOSE 145*  141*    Lipid Panel     Component Value Date/Time   CHOL 210 (H) 06/14/2018 1057   TRIG 66 06/14/2018 1057   HDL 48 06/14/2018 1057   CHOLHDL 4.4 06/14/2018 1057   CHOLHDL 4.0 02/05/2015 1128   VLDL 12 02/05/2015 1128   LDLCALC 149 (H) 06/14/2018 1057    BNP (last 3 results) No results for input(s): BNP in the last 8760 hours.  HEMOGLOBIN A1C Lab Results  Component Value Date   HGBA1C 5.5 09/05/2016   MPG 117 (H) 02/05/2015    Cardiac Panel (last 3 results) No results for input(s): CKTOTAL, CKMB, RELINDX in the last 8760 hours.  Invalid input(s): TROPONINHS  No results found for: CKTOTAL, CKMB, CKMBINDEX   TSH No results for input(s): TSH in the last 8760 hours.   Medications Prior to Admission  Medication Sig Dispense Refill  . albuterol (PROVENTIL) (5 MG/ML) 0.5% nebulizer solution Take 0.5 mLs (2.5 mg total) by nebulization every 6 (six) hours as needed for wheezing or shortness of breath. 20 mL 12  . lidocaine (LIDODERM) 5 % Place 1 patch onto the skin daily. Remove & Discard patch within 12 hours or as directed by MD 30 patch 0  . magnesium citrate SOLN Take 1 Bottle by mouth once.    . polyethylene glycol (MIRALAX / GLYCOLAX) packet Take 17 g by mouth 2 (two) times daily. 14 each 0  . predniSONE (DELTASONE) 20 MG tablet Take 3 tablets (60 mg total) by mouth daily. 15 tablet 0  . Rivaroxaban (XARELTO STARTER PACK) 15 &  20 MG TBPK Take as directed on package: Start with one  tablet by mouth twice a day with food. On Day 22, switch to one  tablet once a day with food. 51 each 0  . rivaroxaban (XARELTO) 20 MG TABS tablet Take 1 tablet (20 mg total) by mouth daily with supper. 90 tablet 3      Current Facility-Administered Medications:  .  0.9 %  sodium chloride infusion, , Intravenous, Continuous, Eylin Pontarelli J, MD, Last Rate: 75 mL/hr at 01/07/19 1745 .  0.9 %  sodium chloride infusion, 250 mL, Intravenous, PRN,  Macaulay Reicher J, MD .  acetaminophen (TYLENOL) tablet 650 mg, 650 mg, Oral, Q4H PRN, Artemisa Sladek J, MD .  albuterol (PROVENTIL) (2.5 MG/3ML) 0.083% nebulizer solution 2.5 mg, 2.5 mg, Nebulization, Q6H PRN, Niema Carrara J, MD .  Melene Muller ON 01/08/2019] clopidogrel (PLAVIX) tablet 75 mg, 75 mg, Oral, Daily, Brandin Stetzer J, MD .  heparin ADULT infusion 100 units/mL (25000 units/228mL sodium chloride 0.45%), 850 Units/hr, Intravenous, Continuous, Zamier Eggebrecht J, MD, Last Rate: 8.5 mL/hr at 01/07/19 1749, 850 Units/hr at 01/07/19 1749 .  hydrALAZINE (APRESOLINE) injection 10 mg, 10 mg, Intravenous, Q20 Min PRN, Michalla Ringer J, MD .  labetalol (NORMODYNE) injection 10 mg, 10 mg, Intravenous, Q10 min PRN, Sequoya Hogsett J, MD .  lidocaine (LIDODERM) 5 % 1 patch, 1 patch, Transdermal, Q24H, Mahonri Seiden J, MD, 1 patch at 01/07/19 1738 .  morphine 2 MG/ML injection 2 mg, 2 mg, Intravenous, Q4H PRN, Lourdes Manning J, MD, 2 mg at 01/07/19 1813 .  nitroGLYCERIN (NITROSTAT) SL tablet 0.4 mg, 0.4 mg, Sublingual, Q5 Min x 3 PRN, Aanyah Loa J, MD .  nitroGLYCERIN 50 mg in dextrose 5 % 250 mL (0.2 mg/mL) infusion, 2-200 mcg/min, Intravenous, Titrated, Avri Paiva J, MD, Last Rate: 6 mL/hr at 01/07/19 1655, 20 mcg/min at 01/07/19 1655 .  ondansetron (ZOFRAN) injection 4 mg, 4 mg, Intravenous, Q6H PRN, Amaury Kuzel J, MD .  Melene Muller ON 01/08/2019] rivaroxaban (XARELTO) tablet 20 mg, 20 mg, Oral, Q supper, Keila Turan J, MD .  sodium chloride flush (NS) 0.9 % injection 3 mL, 3 mL, Intravenous, Q12H, Wiley Flicker J, MD .  sodium chloride flush (NS) 0.9 % injection 3 mL, 3 mL, Intravenous, PRN, Roshanda Balazs J, MD .  tirofiban (AGGRASTAT) infusion 50 mcg/mL 100 mL, 0.15 mcg/kg/min, Intravenous, Continuous, Cedricka Sackrider J, MD   Today's Vitals   01/07/19 1536 01/07/19 1541 01/07/19 1541 01/07/19 1618  BP: (!) 148/92  (!) 160/95  (!) 153/94  Pulse: 69  72 68  Resp: 16  (!) 7 19  Temp:    98.7 F (37.1 C)  TempSrc:    Oral  SpO2: 100%  100% 100%  Weight:      Height:      PainSc:  6      Body mass index is 28.06 kg/m.   CARDIAC STUDIES:  EKG 01/07/2019: Serial EKGs showing sinus arrhythmia with anterolateral ST elevation, inferior T wave inversion, subsequently continuous resolution of ST elevation in anterolateral leads.  Echocardiogram pending:   Assessment/Plan  43 year old African-American American male with history of unprovoked PE in 2018, noncompliant with anticoagulation, ongoing tobacco abuse, prior substance abuse, admitted with acute chest pain.  Chest pain:  Acute coronary syndrome with anterolateral ST elevation on presenting EKG.  Coronary angiogram with no culprit stenosis, but ostial LAD thrombus seen.  Event was thought to be thromboembolic MI with spontaneous resolution.  Repeat EKG in  Cath Lab showed complete resolution of ST elevation. Commend Aggrastat and heparin infusion, with transition to Xarelto for indefinite treatment given history of unprovoked PE and thromboembolic MI.  Recommend Plavix without aspirin, to reduce bleeding risk. Recommend total 80 mg, tobacco cessation, blood pressure control.  Since being on progressive unit, patient had recurrent chest pain with no clear ST elevation.  He was started on IV nitroglycerin, but reported "throat closing sensation".  Nitroglycerin was stopped, lidocaine patch was placed.  Morphine IV was ordered for chest pain control, which showed improvement in his chest pain.  CRITICAL CARE Performed by: Vernell Leep   Total critical care time: 35 minutes   Critical care time was exclusive of separately billable procedures and treating other patients.   Critical care was necessary to treat or prevent imminent or life-threatening deterioration.   Critical care was time spent personally by me on the following activities: development of  treatment plan with patient and/or surrogate as well as nursing, discussions with consultants, evaluation of patient's response to treatment, examination of patient, obtaining history from patient or surrogate, ordering and performing treatments and interventions, ordering and review of laboratory studies, ordering and review of radiographic studies, pulse oximetry and re-evaluation of patient's condition.     Nigel Mormon, MD 01/07/2019, 6:26 PM [facility] Pager: [phone] Office: [phone] If no answer: [phone]

## 2019-01-07 NOTE — Progress Notes (Signed)
Patient complained of 7/10 pressure, standing on my chest. Notified Dr. Virgina Jock of chest pain. Patient stated it was 5/10 when he came up from the cath lab and increased to 7/10 at 1630. 12 lead ECG obtained and Dr. Virgina Jock into see patient. Aggrastat infusing at 15.5 mls per hour and NS infusing at 20 mls/hr.  1655 nitroglycerin infusion started at 20 mcg. Patient complained of sweet taste and then felt like throat was closing up. Discontinued nitroglycerin infusion, patient stated that his throat was still swollen but not getting any worse. Attempted to infuse nitroglycerin at 10 mcg and patient noticed the sweetness again. Notified Dr. Virgina Jock and nitroglycerin infusion discontinued as instructed, 12 lead ECG completed.. Lidocaine patch applied to chest. Heparin infusion started at 8.5 mls/hr as instructed. Dr. Virgina Jock notified of PTT > 200. Patient given 600mg  Plavix po.  Dr. Virgina Jock into see patient and evaluate 12 lead ECG  1813 patient given morphine 2 mg IV. Pain resolved quickly and has not reoccurred. 12 lead ECG obtained and transmitted to chart as instructed at 1915.

## 2019-01-07 NOTE — Progress Notes (Addendum)
ANTICOAGULATION CONSULT NOTE - Initial Consult  Pharmacy Consult for heparin Indication: chest pain/ACS  Allergies  Allergen Reactions  . Amoxicillin Anaphylaxis    Did it involve swelling of the face/tongue/throat, SOB, or low BP? Yes Did it involve sudden or severe rash/hives, skin peeling, or any reaction on the inside of your mouth or nose? Yes Did you need to seek medical attention at a hospital or doctor's office? Yes When did it last happen?43 YEARS OLD If all above answers are "NO", may proceed with cephalosporin use.   Marland Kitchen Penicillins Anaphylaxis    Did it involve swelling of the face/tongue/throat, SOB, or low BP? Yes Did it involve sudden or severe rash/hives, skin peeling, or any reaction on the inside of your mouth or nose? Yes Did you need to seek medical attention at a hospital or doctor's office? Yes When did it last happen?CHILDHOOD If all above answers are "NO", may proceed with cephalosporin use.     Patient Measurements: Height: 5\' 9"  (175.3 cm) Weight: 190 lb (86.2 kg) IBW/kg (Calculated) : 70.7 Heparin Dosing Weight: 86.2 kg  Vital Signs: BP: 160/95 (10/20 1541) Pulse Rate: 72 (10/20 1541)  Labs: No results for input(s): HGB, HCT, PLT, APTT, LABPROT, INR, HEPARINUNFRC, HEPRLOWMOCWT, CREATININE, CKTOTAL, CKMB, TROPONINIHS in the last 72 hours.  CrCl cannot be calculated (Patient's most recent lab result is older than the maximum 21 days allowed.).   Medical History: Past Medical History:  Diagnosis Date  . Anemia   . Asthma    as a child   . Blood clot in vein   . DVT (deep venous thrombosis) ([place])   . Pulmonary embolism (HCC)     Medications:  Scheduled:  . aspirin  324 mg Oral NOW   Or  . aspirin  300 mg Rectal NOW  . [START ON 01/08/2019] aspirin EC  81 mg Oral Daily    Assessment: 47 yom presenting with STEMI, finding normal coronaries except focal filling defect concerning for spontaneous thrombus - hx of PE (3/20), has  been off given unable to afford DOAC.   Plan for Aggrastat for 18 hours and heparin - plans to change to DOAC + plavix after. No s/sx of bleeding.   Goal of Therapy:  Heparin level 0.3-0.5 units/ml while on concurrent Aggrastat, then can target 0.3-0.7 units/mL Monitor platelets by anticoagulation protocol: Yes   Plan:  Start heparin infusion at 850 units/hr Check anti-Xa level in 6 hours and daily while on heparin Continue to monitor H&H and platelets  Xarelto 20 mg daily ordered to start on 10/21 with supper Plans to discontinue heparin infusion at time of transition Will monitor CBC, renal fx, and s/sx of bleeding on Xarelto and plavix   Antonietta Jewel, PharmD, BCCCP Clinical Pharmacist  Phone: [phone]  Please check AMION for all [place] phone numbers After 10:00 PM, call [place] [phone] 01/07/2019,4:02 PM

## 2019-01-07 NOTE — Progress Notes (Signed)
TR BAND REMOVAL  LOCATION:    right radial  DEFLATED PER PROTOCOL:    Yes.    TIME BAND OFF / DRESSING APPLIED:    2115   SITE UPON ARRIVAL:    Level 0  SITE AFTER BAND REMOVAL:    Level 0  CIRCULATION SENSATION AND MOVEMENT:    Within Normal Limits   Yes.    COMMENTS:   Post TR band instructions given, applied sterile dressing, good capillary refill.

## 2019-01-07 NOTE — Progress Notes (Signed)
Serial EKG's with anterolateral ST elevation and spontaneous resolution.

## 2019-01-08 ENCOUNTER — Inpatient Hospital Stay (HOSPITAL_COMMUNITY): Payer: Self-pay

## 2019-01-08 ENCOUNTER — Other Ambulatory Visit: Payer: Self-pay

## 2019-01-08 ENCOUNTER — Encounter (HOSPITAL_COMMUNITY): Payer: Self-pay | Admitting: Cardiology

## 2019-01-08 LAB — HEPARIN LEVEL (UNFRACTIONATED): Heparin Unfractionated: 0.25 IU/mL — ABNORMAL LOW (ref 0.30–0.70)

## 2019-01-08 LAB — BASIC METABOLIC PANEL
Anion gap: 8 (ref 5–15)
BUN: 13 mg/dL (ref 6–20)
CO2: 22 mmol/L (ref 22–32)
Calcium: 8.3 mg/dL — ABNORMAL LOW (ref 8.9–10.3)
Chloride: 105 mmol/L (ref 98–111)
Creatinine, Ser: 1.54 mg/dL — ABNORMAL HIGH (ref 0.61–1.24)
GFR calc Af Amer: >60 mL/min (ref >60)
GFR calc non Af Amer: 54 mL/min — ABNORMAL LOW (ref >60)
Glucose, Bld: 147 mg/dL — ABNORMAL HIGH (ref 70–99)
Potassium: 4 mmol/L (ref 3.5–5.1)
Sodium: 135 mmol/L (ref 135–145)

## 2019-01-08 LAB — ECHOCARDIOGRAM COMPLETE
Height: 69 in
Weight: 3003.2 oz

## 2019-01-08 LAB — CBC
HCT: 43.3 % (ref 39.0–52.0)
Hemoglobin: 15 g/dL (ref 13.0–17.0)
MCH: 32.3 pg (ref 26.0–34.0)
MCHC: 34.6 g/dL (ref 30.0–36.0)
MCV: 93.3 fL (ref 80.0–100.0)
Platelets: 241 10*3/uL (ref 150–400)
RBC: 4.64 MIL/uL (ref 4.22–5.81)
RDW: 13.2 % (ref 11.5–15.5)
WBC: 11.2 10*3/uL — ABNORMAL HIGH (ref 4.0–10.5)
nRBC: 0 % (ref 0.0–0.2)

## 2019-01-08 LAB — RAPID URINE DRUG SCREEN, HOSP PERFORMED
Amphetamines: POSITIVE — AB
Barbiturates: NOT DETECTED
Benzodiazepines: NOT DETECTED
Cocaine: NOT DETECTED
Opiates: POSITIVE — AB
Tetrahydrocannabinol: NOT DETECTED

## 2019-01-08 LAB — TROPONIN I (HIGH SENSITIVITY): Troponin I (High Sensitivity): 9580 ng/L (ref <18)

## 2019-01-08 MED ORDER — CLOPIDOGREL BISULFATE 75 MG PO TABS: 75.0000 mg | ORAL_TABLET | Freq: Every day | ORAL | 3 refills | Status: DC

## 2019-01-08 MED ORDER — CLOPIDOGREL BISULFATE 75 MG PO TABS
75.0000 mg | ORAL_TABLET | Freq: Every day | ORAL | Status: DC
  Administered 2019-01-08 – 2019-01-09 (×2): 75 mg via ORAL
  Filled 2019-01-08 (×2): qty 1

## 2019-01-08 MED ORDER — ATORVASTATIN CALCIUM 80 MG PO TABS: 80.0000 mg | ORAL_TABLET | Freq: Every day | ORAL | 3 refills | Status: DC

## 2019-01-08 MED ORDER — METOPROLOL TARTRATE 25 MG PO TABS: 25.0000 mg | ORAL_TABLET | Freq: Two times a day (BID) | ORAL | 2 refills | Status: DC

## 2019-01-08 MED ORDER — RIVAROXABAN 20 MG PO TABS: 20.0000 mg | ORAL_TABLET | Freq: Every day | ORAL | 2 refills | Status: DC

## 2019-01-08 MED ORDER — RIVAROXABAN 20 MG PO TABS
20.0000 mg | ORAL_TABLET | Freq: Every day | ORAL | Status: DC
  Administered 2019-01-08: 20 mg via ORAL
  Filled 2019-01-08 (×2): qty 1

## 2019-01-08 MED FILL — XARELTO 20 MG TABLET: 20 | 30 days supply | Qty: 30 | Fill #0

## 2019-01-08 MED FILL — METOPROLOL TARTRATE 25 MG T: 25 | 30 days supply | Qty: 60 | Fill #0

## 2019-01-08 MED FILL — ATORVASTATIN CALCIUM 80 MG: 80 | 30 days supply | Qty: 30 | Fill #0

## 2019-01-08 MED FILL — CLOPIDOGREL 75 MG TABLET: 75 | 30 days supply | Qty: 30 | Fill #0

## 2019-01-08 NOTE — Progress Notes (Signed)
ANTICOAGULATION CONSULT NOTE - [place] for heparin Indication: chest pain/ACS  Allergies  Allergen Reactions  . Amoxicillin Anaphylaxis    Did it involve swelling of the face/tongue/throat, SOB, or low BP? Yes Did it involve sudden or severe rash/hives, skin peeling, or any reaction on the inside of your mouth or nose? Yes Did you need to seek medical attention at a hospital or doctor's office? Yes When did it last happen?43 YEARS OLD If all above answers are "NO", may proceed with cephalosporin use.   Marland Kitchen Penicillins Anaphylaxis    Did it involve swelling of the face/tongue/throat, SOB, or low BP? Yes Did it involve sudden or severe rash/hives, skin peeling, or any reaction on the inside of your mouth or nose? Yes Did you need to seek medical attention at a hospital or doctor's office? Yes When did it last happen?CHILDHOOD If all above answers are "NO", may proceed with cephalosporin use.     Patient Measurements: Height: 5\' 9"  (175.3 cm) Weight: 187 lb 11.2 oz (85.1 kg) IBW/kg (Calculated) : 70.7 Heparin Dosing Weight: 86.2 kg  Vital Signs: Temp: 98 F (36.7 C) (10/21 0402) Temp Source: Oral (10/21 0402) BP: 114/68 (10/21 0402) Pulse Rate: 55 (10/21 0402)  Labs: Recent Labs    01/07/19 1524 01/07/19 1705 01/07/19 2307 01/08/19 0353  HGB 17.3*  --   --  15.0  HCT 51.0  --   --  43.3  PLT  --   --   --  241  APTT >200*  --   --   --   LABPROT 15.3*  --   --   --   INR 1.2  --   --   --   HEPARINUNFRC  --   --  0.31 0.25*  CREATININE 1.50*  1.50*  --   --  1.54*  TROPONINIHS 4 54*  --   --     Estimated Creatinine Clearance: 66.2 mL/min (A) (by C-G formula based on SCr of 1.54 mg/dL (H)).   Medical History: Past Medical History:  Diagnosis Date  . Anemia   . Asthma    as a child   . Blood clot in vein   . DVT (deep venous thrombosis) ([place])   . Pulmonary embolism ([facility])     Medications:  Scheduled:  .  atorvastatin  80 mg Oral q1800  . clopidogrel  75 mg Oral Daily  . lidocaine  1 patch Transdermal Q24H  . metoprolol tartrate  25 mg Oral BID  . rivaroxaban  20 mg Oral Q supper  . sodium chloride flush  3 mL Intravenous Q12H  . traZODone  50 mg Oral QHS    Assessment: 43 yom presenting with STEMI found to have clean coronaries in cath lab except focal filling defect. MD concern for spontaneous thrombus given hx of recurrent PE (2018 and 05/2018). Pt has been on Xarelto previously but is now off given cost. Pharmacy asked to start tirofiban x18h and restart heparin, then transition to [place] 10/21.    Goal of Therapy:  Heparin level 0.3-0.5 units/ml while on concurrent Aggrastat, then can target 0.3-0.7 units/mL Monitor platelets by anticoagulation protocol: Yes   Plan:  Stop heparin and tirofiban Begin Xarelto 20mg /d  Arrie Senate, PharmD, BCPS Clinical Pharmacist [phone] Please check AMION for all [facility] numbers 01/08/2019

## 2019-01-08 NOTE — Discharge Summary (Signed)
Physician Discharge Summary  Patient ID: Jeremiah KnifeRahki R Folmar MRN: [id number] DOB/AGE: 05-06-1975 43 y.o.  Admit date: 01/07/2019 Discharge date: 01/08/2019  Primary Discharge Diagnosis: Thromboembolic acute coronary syndrome  Secondary Discharge Diagnosis: History of unprovoked PE 2018 Tobacco abuse   Hospital Course:   43 year old African-American American male, 1/2 PPD smoker with history of unprovoked PE in 2018, history of substance abuse, brought to ED with chest pain and anterolateral ST elevation.  Chest pain started around 1 PM, described as squeezing, pressure-like.  Patient had just completed plasma donation around noon, and had 2 alcoholic drinks, but denies usage of cocaine.  Given ongoing ST elevation, he was emergently take to cath lab.  Coronary angiography showed normal coronary arteries without any stenosis, but a filling defect in ostial LAD suggestive of thrombus.  No intervention was performed, and patient was started on heparin and Aggrastat.  Repeat EKG while in Cath Lab showed complete resolution of ST elevation.  Patient had recurrent chest pain throughout 01/07/2019 without any EKG changes concerning for ST elevation. Troponin HS increased to 9580. Echocardiogram showed normal EF without wall motion abnormalities. He was medically managed with aspirin, IV morphine.  He reportedly had allergic reaction to nitroglycerin-"throat closing sensation", thus not able to use.  He was transitioned from heparin and Aggrastat to Plavix and Xarelto on 01/08/2019.  He should continue these agents indefinitely given his history of unprovoked DVT and thromboembolic STEMI.  Patient had previously undergone hypercoagulability work-up including anticardiolipin antibody and antithrombin panel and March 2020, both were negative.  I only added antiphospholipid antibody.  I do not think the rest of the hypercoagulability work-up will change his clinical management, and this not warranted in this  patient with no insurance coverage.  Patient's chest pain improved.  He was able to ambulate without any recurrent chest pain.  He was discharged on 01/08/2019 with transition of care follow-up arranged.  Given the chronology of events in patient with spontaneous thromboembolic events, I recommend avoiding plasma donation at least for 4 weeks.    Discharge Exam: Blood pressure (!) 156/79, pulse 69, temperature 98 F (36.7 C), temperature source Oral, resp. rate (!) 21, height 5\' 9"  (1.753 m), weight 85.1 kg, SpO2 99 %.   Constitutional: He is oriented to person, place, and time. He appears well-developed and well-nourished. No distress.  HENT:  Head: Normocephalic and atraumatic.  Eyes: Pupils are equal, round, and reactive to light. Conjunctivae are normal.  Neck: No JVD present.  Cardiovascular: Normal rate, regular rhythm and intact distal pulses.  No murmur heard. Pulmonary/Chest: Effort normal and breath sounds normal. He has no wheezes. He has no rales.  Abdominal: Soft. Bowel sounds are normal. There is no rebound.  Musculoskeletal:        General: No edema.  Lymphadenopathy:    He has no cervical adenopathy.  Neurological: He is alert and oriented to person, place, and time. No cranial nerve deficit.  Skin: Skin is warm and dry.  Psychiatric: He has a normal mood and affect.  Nursing note and vitals reviewed.  Recommendations on discharge:  Smoking cessation   Significant Diagnostic Studies:  EKG 01/08/2019: Sinus rhythm 68 bpm with sinus arrhythmia. Otherwise normal EKG.  Echocardiogram 01/08/2019:  1. Left ventricular ejection fraction, by visual estimation, is 60 to 65%. The left ventricle has normal function. There is no left ventricular hypertrophy.  2. Global right ventricle has normal systolic function.The right ventricular size is normal. No increase in right ventricular wall thickness.  3. No significant valvular abnormalities.  4. normal right atrial  pressure.  Coronary angiography 01/07/2019: LM: Normal LAD: Ostial LAD has no significant stenosis, but has a focal filling defect. In the setting of STEMI presentation, this most likely represents spontaneous thrombus, possible form a minor plaque rupture, leading to thromboembolic STEMI. Rest of the LAD system is normal. LCx: Normal RCA: Large, dominant. Normal.  Repeat EKG in cath lab showed resolution   Labs:   Lab Results  Component Value Date   WBC 11.2 (H) 01/08/2019   HGB 15.0 01/08/2019   HCT 43.3 01/08/2019   MCV 93.3 01/08/2019   PLT 241 01/08/2019    Recent Labs  Lab 01/07/19 1524 01/08/19 0353  NA 141  140 135  K 3.7  3.7 4.0  CL 104  108 105  CO2 21* 22  BUN 13  12 13   CREATININE 1.50*  1.50* 1.54*  CALCIUM 8.3* 8.3*  PROT 4.9*  --   BILITOT 1.2  --   ALKPHOS 56  --   ALT 21  --   AST 23  --   GLUCOSE 145*  141* 147*    Lipid Panel     Component Value Date/Time   CHOL 210 (H) 06/14/2018 1057   TRIG 66 06/14/2018 1057   HDL 48 06/14/2018 1057   CHOLHDL 4.4 06/14/2018 1057   CHOLHDL 4.0 02/05/2015 1128   VLDL 12 02/05/2015 1128   LDLCALC 149 (H) 06/14/2018 1057    BNP (last 3 results) No results for input(s): BNP in the last 8760 hours.  HEMOGLOBIN A1C Lab Results  Component Value Date   HGBA1C 5.5 09/05/2016   MPG 117 (H) 02/05/2015    Results for SHIVA, SAHAGIAN (MRN [id number]) as of 01/09/2019 09:03  Ref. Range 11/02/2018 04:25 01/07/2019 15:24 01/07/2019 17:05 01/08/2019 09:28  Troponin I (High Sensitivity) Latest Ref Range: <18 ng/L 5 4 54 (H) 9,580 (HH)     FOLLOW UP PLANS AND APPOINTMENTS  Allergies as of 01/09/2019      Reactions   Amoxicillin Anaphylaxis   Did it involve swelling of the face/tongue/throat, SOB, or low BP? Yes Did it involve sudden or severe rash/hives, skin peeling, or any reaction on the inside of your mouth or nose? Yes Did you need to seek medical attention at a hospital or doctor's office?  Yes When did it last happen?43 YEARS OLD If all above answers are "NO", may proceed with cephalosporin use.   Penicillins Anaphylaxis   Did it involve swelling of the face/tongue/throat, SOB, or low BP? Yes Did it involve sudden or severe rash/hives, skin peeling, or any reaction on the inside of your mouth or nose? Yes Did you need to seek medical attention at a hospital or doctor's office? Yes When did it last happen?CHILDHOOD If all above answers are "NO", may proceed with cephalosporin use.   Nitroglycerin    "throat closing and tongue heaviness sensation" with SL and IV NTG      Medication List    STOP taking these medications   albuterol (5 MG/ML) 0.5% nebulizer solution Commonly known as: PROVENTIL   lidocaine 5 % Commonly known as: LIDODERM   magnesium citrate Soln   polyethylene glycol 17 g packet Commonly known as: MIRALAX / GLYCOLAX   predniSONE 20 MG tablet Commonly known as: DELTASONE     TAKE these medications   atorvastatin 80 MG tablet Commonly known as: LIPITOR Take 1 tablet (80 mg total) by mouth daily at 6 PM.  clopidogrel 75 MG tablet Commonly known as: PLAVIX Take 1 tablet (75 mg total) by mouth daily.   metoprolol tartrate 25 MG tablet Commonly known as: LOPRESSOR Take 1 tablet (25 mg total) by mouth 2 (two) times daily.   rivaroxaban 20 MG Tabs tablet Commonly known as: XARELTO Take 1 tablet (20 mg total) by mouth daily with supper. What changed: Another medication with the same name was removed. Continue taking this medication, and follow the directions you see here.      Follow-up Information    Elder Negus, MD Follow up on 01/22/2019.   Specialties: Cardiology, Radiology Why: 11:00 AM Contact information: [address] [place] [address] [phone]            Truett Mainland MD, [facility] Pager: [phone] Office: [phone] If no answer:  [phone]

## 2019-01-08 NOTE — Discharge Instructions (Signed)

## 2019-01-08 NOTE — Progress Notes (Signed)
CRITICAL VALUE ALERT  Critical Value:  Trop: 0100  Date & Time Notied:  01/08/19 11:18  Provider Notified: Virgina Jock, MD  Orders Received/Actions taken: Verbal order to closely monitor and page if changes.   Pt ambulating in room. Asymptotic and states he feels great. RN will continue to monitor.

## 2019-01-08 NOTE — Progress Notes (Signed)
  Echocardiogram 2D Echocardiogram has been performed.  Jeremiah [name] 01/08/2019, 11:23 AM

## 2019-01-08 NOTE — Care Management (Signed)
1247 01-08-19 CM received a referral regarding Xarelto. Patient has previously been on Xarelto and utilized the 30 day free card. Patient will not be able to use the card again. CM did speak with MD Patwardhan and will assist with samples in the office and assistance with the patient assistance application. Patient is aware of plan of care. No further needs from CM at this time.  Bethena Roys, RN, BSN Case Manager [phone]

## 2019-01-08 NOTE — Progress Notes (Addendum)
Subjective:  No chest pain this morning  HS troponin peaked at 9580  Objective:  Vital Signs in the last 24 hours: Temp:  [98 F (36.7 C)-98.7 F (37.1 C)] 98 F (36.7 C) (10/21 0402) Pulse Rate:  [55-93] 69 (10/21 0947) Resp:  [7-21] 21 (10/21 0947) BP: (114-160)/(68-100) 156/79 (10/21 0947) SpO2:  [98 %-100 %] 99 % (10/21 0947) Weight:  [85.1 kg-86.2 kg] 85.1 kg (10/21 0430)  Intake/Output from previous day: 10/20 0701 - 10/21 0700 In: 629.9 [P.O.:240; I.V.:389.9] Out: 650 [Urine:650]  Physical Exam Constitutional: He is oriented to person, place, and time. He appears well-developed and well-nourished. No distress.  HENT:  Head: Normocephalic and atraumatic.  Eyes: Pupils are equal, round, and reactive to light. Conjunctivae are normal.  Neck: No JVD present.  Cardiovascular: Normal rate, regular rhythm and intact distal pulses.  No murmur heard. Pulmonary/Chest: Effort normal and breath sounds normal. He has no wheezes. He has no rales.  Abdominal: Soft. Bowel sounds are normal. There is no rebound.  Musculoskeletal:        General: No edema.  Lymphadenopathy:    He has no cervical adenopathy.  Neurological: He is alert and oriented to person, place, and time. No cranial nerve deficit.  Skin: Skin is warm and dry.  Psychiatric: He has a normal mood and affect.  Nursing note and vitals reviewed.   Lab Results: BMP Recent Labs    11/02/18 0425 01/07/19 1524 01/08/19 0353  NA 140 141  140 135  K 4.0 3.7  3.7 4.0  CL 107 104  108 105  CO2 21* 21* 22  GLUCOSE 107* 145*  141* 147*  BUN 10 13  12 13   CREATININE 1.30* 1.50*  1.50* 1.54*  CALCIUM 8.6* 8.3* 8.3*  GFRNONAA >60 56* 54*  GFRAA >60 >60 >60    CBC Recent Labs  Lab 01/08/19 0353  WBC 11.2*  RBC 4.64  HGB 15.0  HCT 43.3  PLT 241  MCV 93.3  MCH 32.3  MCHC 34.6  RDW 13.2    HEMOGLOBIN A1C Lab Results  Component Value Date   HGBA1C 5.5 09/05/2016   MPG 117 (H) 02/05/2015    Results for AHMAN, DUGDALE (MRN Jackelyn Knife) as of 01/08/2019 11:22  Ref. Range 11/02/2018 04:25 01/07/2019 15:24 01/07/2019 17:05 01/08/2019 09:28  Troponin I (High Sensitivity) Latest Ref Range: <18 ng/L 5 4 54 (H) 9,580 (HH)     Lipid Panel     Component Value Date/Time   CHOL 210 (H) 06/14/2018 1057   TRIG 66 06/14/2018 1057   HDL 48 06/14/2018 1057   CHOLHDL 4.4 06/14/2018 1057   CHOLHDL 4.0 02/05/2015 1128   VLDL 12 02/05/2015 1128   LDLCALC 149 (H) 06/14/2018 1057     Hepatic Function Panel Recent Labs    06/14/18 1057 01/07/19 1524  PROT 6.1 4.9*  ALBUMIN 3.8* 3.1*  AST 26 23  ALT 19 21  ALKPHOS 101 56  BILITOT 0.3 1.2   EKG 01/08/2019: Sinus rhythm 68 bpm with sinus arrhythmia. Otherwise normal EKG.  Echocardiogram 01/08/2019:  1. Left ventricular ejection fraction, by visual estimation, is 60 to 65%. The left ventricle has normal function. There is no left ventricular hypertrophy.  2. Global right ventricle has normal systolic function.The right ventricular size is normal. No increase in right ventricular wall thickness.  3. No significant valvular abnormalities.  4. normal right atrial pressure.  Coronary angiography 01/07/2019: LM: Normal LAD: Ostial LAD has no significant stenosis, but has  a focal filling defect. In the setting of STEMI presentation, this most likely represents spontaneous thrombus, possible form a minor plaque rupture, leading to thromboembolic STEMI. Rest of the LAD system is normal. LCx: Normal RCA: Large, dominant. Normal.  Repeat EKG in cath lab showed resolution   Assessment & Recommendations:  44 year old African-American American male with history of unprovoked PE in 2018, noncompliant with anticoagulation, ongoing tobacco abuse, prior substance abuse, admitted with acute chest pain.  Chest pain:  Acute coronary syndrome with anterolateral ST elevation on presenting EKG.  Coronary angiogram with no culprit stenosis, but  ostial LAD thrombus seen.  Event was thought to be thromboembolic MI with spontaneous resolution.  Repeat EKG in Cath Lab showed complete resolution of ST elevation. Completed overnight heparin and Aggrastat, now transitioning to p.o. Xarelto and Plavix.   Peak high-sensitivity troponin 9580. Echocardiogram with normal EF, no WMA.  Recommend hospital stay for 1 more day to look for any MI complications. Recommend total 80 mg, tobacco cessation, blood pressure control.    Nigel Mormon, M.D. 01/08/2019, 11:21 AM [facility] Pager: [phone] Office: [phone] If no answer: [phone]

## 2019-01-09 ENCOUNTER — Telehealth: Payer: Self-pay

## 2019-01-09 LAB — BASIC METABOLIC PANEL
Anion gap: 5 (ref 5–15)
BUN: 13 mg/dL (ref 6–20)
CO2: 25 mmol/L (ref 22–32)
Calcium: 8.5 mg/dL — ABNORMAL LOW (ref 8.9–10.3)
Chloride: 110 mmol/L (ref 98–111)
Creatinine, Ser: 1.48 mg/dL — ABNORMAL HIGH (ref 0.61–1.24)
GFR calc Af Amer: >60 mL/min (ref >60)
GFR calc non Af Amer: 57 mL/min — ABNORMAL LOW (ref >60)
Glucose, Bld: 104 mg/dL — ABNORMAL HIGH (ref 70–99)
Potassium: 4.6 mmol/L (ref 3.5–5.1)
Sodium: 140 mmol/L (ref 135–145)

## 2019-01-09 LAB — CBC
HCT: 44.2 % (ref 39.0–52.0)
Hemoglobin: 15.4 g/dL (ref 13.0–17.0)
MCH: 32.3 pg (ref 26.0–34.0)
MCHC: 34.8 g/dL (ref 30.0–36.0)
MCV: 92.7 fL (ref 80.0–100.0)
Platelets: 211 10*3/uL (ref 150–400)
RBC: 4.77 MIL/uL (ref 4.22–5.81)
RDW: 13.1 % (ref 11.5–15.5)
WBC: 9 10*3/uL (ref 4.0–10.5)
nRBC: 0 % (ref 0.0–0.2)

## 2019-01-09 NOTE — Progress Notes (Signed)
RN asked pt to call family for transportation. Pt stated he called and she would arrive by 10:30. TOC also stated there was a co-pay and pt's family would bring money when they arrived. Will continue to monitor.

## 2019-01-09 NOTE — Telephone Encounter (Signed)
Location of hospitalization: [facility] Reason for hospitalization: Cath Date of discharge: 01/09/19 Date of first communication with patient: today Person contacting patient: Me Current symptoms: Feeling well Do you understand why you were in the Hospital: Yes Questions regarding discharge instructions: None Where were you discharged to: Home Medications reviewed: Yes Allergies reviewed: Yes Dietary changes reviewed: Yes. Discussed low fat and low salt diet.  Referals reviewed: NA Activities of Daily Living: Able to with mild limitations Any transportation issues/concerns: None Any patient concerns: None Confirmed importance & date/time of Follow up appt: Yes Confirmed with patient if condition begins to worsen call. Pt was given the office number and encouraged to call back with questions or concerns: Yes

## 2019-01-12 LAB — ANTIPHOSPHOLIPID SYNDROME EVAL, BLD
Anticardiolipin IgA: <9 APL U/mL (ref 0–11)
Anticardiolipin IgG: <9 GPL U/mL (ref 0–14)
Anticardiolipin IgM: <9 MPL U/mL (ref 0–12)
DRVVT: 33 s (ref 0.0–47.0)
PTT Lupus Anticoagulant: 31.4 s (ref 0.0–51.9)
Phosphatydalserine, IgA: 1 APS IgA (ref 0–20)
Phosphatydalserine, IgG: 6 GPS IgG (ref 0–11)
Phosphatydalserine, IgM: 4 MPS IgM (ref 0–25)

## 2019-01-21 DIAGNOSIS — Z86718 Personal history of other venous thrombosis and embolism: Secondary | ICD-10-CM | POA: Insufficient documentation

## 2019-01-21 NOTE — Progress Notes (Signed)
Transition of care follow up.  Subjective:   Jeremiah [name], male    DOB: 12-25-1975, 43 y.o.   MRN: [id number]   Chief Complaint  Patient presents with  . Thromboembolic myocardial infarction    LHC  . Hospitalization Follow-up    HPI  43 year old African-American American male, 1/2 PPD smoker with history of unprovoked PE in 2018, history of substance abuse, brought to ED on 01/07/2019 with chest pain and anterolateral ST elevation.Chest pain started around 1 PM, described as squeezing, pressure-like.Patient had just completed plasma donation around noon, and had 2 alcoholic drinks, but denies usage of cocaine. Given ongoing ST elevation, he was emergently take to cath lab.Coronary angiography showed normal coronary arteries without any stenosis, but a filling defect in ostial LAD suggestive of thrombus. No intervention was performed, and patient was started on heparin and Aggrastat. Repeat EKG while in Cath Lab showed complete resolution of ST elevation.  Patient had recurrent chest pain throughout 01/07/2019 without any EKG changes concerning for ST elevation. Troponin HS increased to 9580. Echocardiogram showed normal EF without wall motion abnormalities. He was medically managed with aspirin, IV morphine.  He reportedly had allergic reaction to nitroglycerin-"throat closing sensation", thus not able to use.  He was transitioned from heparin and Aggrastat to Plavix and Xarelto on 01/08/2019.  He should continue these agents indefinitely given his history of unprovoked DVT and thromboembolic STEMI.  Patient had previously undergone hypercoagulability work-up including anticardiolipin antibody and antithrombin panel and March 2020, both were negative.  I only added antiphospholipid antibody.  I do not think the rest of the hypercoagulability work-up will change his clinical management, and this not warranted in this patient with no insurance coverage.  Patient's chest pain  improved.  He was able to ambulate without any recurrent chest pain.  He was discharged on 01/08/2019 with transition of care follow-up arranged. Given the chronology of events in patient with spontaneous thromboembolic events, I recommended avoiding plasma donation at least for 4 weeks.   Patient has not had any chest pain similar to his presentaiton. He has been compliant with his medical therapy. Diastolic blood pressure elevated. Patient endorses having had corn beef before his visit today.   Past Medical History:  Diagnosis Date  . Anemia   . Asthma    as a child   . Blood clot in vein   . DVT (deep venous thrombosis) ([place])   . Pulmonary embolism [facility])      Past Surgical History:  Procedure Laterality Date  . LEFT HEART CATH AND CORONARY ANGIOGRAPHY N/A 01/07/2019   Procedure: LEFT HEART CATH AND CORONARY ANGIOGRAPHY;  Surgeon: Nigel Mormon, MD;  Location: [place] [facility];  Service: Cardiovascular;  Laterality: N/A;     Social History   Socioeconomic History  . Marital status: Single    Spouse name: Not on file  . Number of children: Not on file  . Years of education: Not on file  . Highest education level: Not on file  Occupational History  . Not on file  Social Needs  . Financial resource strain: Not on file  . Food insecurity    Worry: Not on file    Inability: Not on file  . Transportation needs    Medical: Not on file    Non-medical: Not on file  Tobacco Use  . Smoking status: Current Every Day Smoker    Packs/day: 0.25    Types: Cigarettes  . Smokeless tobacco: Never Used  Substance and Sexual Activity  . Alcohol use: Yes    Alcohol/week: 15.0 standard drinks    Types: 15 Cans of beer per week  . Drug use: No  . Sexual activity: Not on file  Lifestyle  . Physical activity    Days per week: Not on file    Minutes per session: Not on file  . Stress: Not on file  Relationships  . Social Musician on phone: Not on file    Gets  together: Not on file    Attends religious service: Not on file    Active member of club or organization: Not on file    Attends meetings of clubs or organizations: Not on file    Relationship status: Not on file  . Intimate partner violence    Fear of current or ex partner: Not on file    Emotionally abused: Not on file    Physically abused: Not on file    Forced sexual activity: Not on file  Other Topics Concern  . Not on file  Social History Narrative  . Not on file     Family History  Problem Relation Age of Onset  . Diabetes Father   . Hypertension Father   . Diabetes Brother   . Hypertension Brother   . Diabetes Paternal Aunt   . Hypertension Paternal Aunt   . Diabetes Paternal Grandmother      Current Outpatient Medications on File Prior to Visit  Medication Sig Dispense Refill  . atorvastatin (LIPITOR) 80 MG tablet Take 1 tablet (80 mg total) by mouth daily at 6 PM. 30 tablet 3  . clopidogrel (PLAVIX) 75 MG tablet Take 1 tablet (75 mg total) by mouth daily. 30 tablet 3  . metoprolol tartrate (LOPRESSOR) 25 MG tablet Take 1 tablet (25 mg total) by mouth 2 (two) times daily. 60 tablet 2  . rivaroxaban (XARELTO) 20 MG TABS tablet Take 1 tablet (20 mg total) by mouth daily with supper. 30 tablet 2   No current facility-administered medications on file prior to visit.     Cardiovascular studies:  EKG 01/22/2019: Sinus rhythm 67 bpm. Nonspecific T wave inversion, inferolateral leads.  EKG 01/08/2019: Sinus rhythm 68 bpm with sinus arrhythmia. Otherwise normal EKG.  Echocardiogram 01/08/2019: 1. Left ventricular ejection fraction, by visual estimation, is 60 to 65%. The left ventricle has normal function. There is no left ventricular hypertrophy. 2. Global right ventricle has normal systolic function.The right ventricular size is normal. No increase in right ventricular wall thickness. 3. No significant valvular abnormalities. 4. normal right atrial pressure.   Coronary angiography 01/07/2019: LM: Normal LAD: Ostial LAD has no significant stenosis, but has a focal filling defect. In the setting of STEMI presentation, this most likely represents spontaneous thrombus, possible form a minor plaque rupture, leading to thromboembolic STEMI. Rest of the LAD system is normal. LCx: Normal RCA: Large, dominant. Normal.  Repeat EKG in cath lab showed resolution   Recent labs: Results for LOLA, LOFARO (MRN [id number]) as of 01/22/2019 11:45  Ref. Range 01/07/2019 17:05 01/08/2019 03:53 01/08/2019 09:28 01/09/2019 03:38  Sodium Latest Ref Range: 135 - 145 mmol/L  135  140  Potassium Latest Ref Range: 3.5 - 5.1 mmol/L  4.0  4.6  Chloride Latest Ref Range: 98 - 111 mmol/L  105  110  CO2 Latest Ref Range: 22 - 32 mmol/L  22  25  Glucose Latest Ref Range: 70 - 99 mg/dL  761 (H)  104 (H)  BUN Latest Ref Range: 6 - 20 mg/dL  13  13  Creatinine Latest Ref Range: 0.61 - 1.24 mg/dL  1.611.54 (H)  0.961.48 (H)  Calcium Latest Ref Range: 8.9 - 10.3 mg/dL  8.3 (L)  8.5 (L)  Anion gap Latest Ref Range: 5 - 15   8  5   GFR, Est Non African American Latest Ref Range: >60 mL/min  54 (L)  57 (L)  GFR, Est African American Latest Ref Range: >60 mL/min  >60  >60  Troponin I (High Sensitivity) Latest Ref Range: <18 [name]/L 54 (H)  9,580 (HH)    Results for Jeremiah [name], Jeremiah [name] (MRN [id number]) as of 01/22/2019 11:45  Ref. Range 01/09/2019 03:38  WBC Latest Ref Range: 4.0 - 10.5 K/uL 9.0  RBC Latest Ref Range: 4.22 - 5.81 MIL/uL 4.77  Hemoglobin Latest Ref Range: 13.0 - 17.0 g/dL 91.415.4  HCT Latest Ref Range: 39.0 - 52.0 % 44.2  MCV Latest Ref Range: 80.0 - 100.0 fL 92.7  MCH Latest Ref Range: 26.0 - 34.0 pg 32.3  MCHC Latest Ref Range: 30.0 - 36.0 g/dL 78.234.8  RDW Latest Ref Range: 11.5 - 15.5 % 13.1  Platelets Latest Ref Range: 150 - 400 K/uL 211  nRBC Latest Ref Range: 0.0 - 0.2 % 0.0   Results for Jeremiah [name], Jeremiah [name] (MRN [id number]) as of 01/22/2019 11:45  Ref. Range 01/08/2019 09:55   Anticardiolipin Ab,IgA,Qn Latest Ref Range: 0 - 11 APL U/mL <9  Anticardiolipin Ab,IgG,Qn Latest Ref Range: 0 - 14 GPL U/mL <9  Anticardiolipin Ab,IgM,Qn Latest Ref Range: 0 - 12 MPL U/mL <9  PTT Lupus Anticoagulant Latest Ref Range: 0.0 - 51.9 sec 31.4  DRVVT Latest Ref Range: 0.0 - 47.0 sec 33.0  Phosphatydalserine, IgG Latest Ref Range: 0 - 11 GPS IgG 6  Phosphatydalserine, IgM Latest Ref Range: 0 - 25 MPS IgM 4  Phosphatydalserine, IgA Latest Ref Range: 0 - 20 APS IgA 1  Lupus Anticoag Interp Unknown Comment:    Review of Systems  Constitution: Negative for decreased appetite, malaise/fatigue, weight gain and weight loss.  HENT: Negative for congestion.   Eyes: Negative for visual disturbance.  Cardiovascular: Negative for chest pain, dyspnea on exertion, leg swelling, palpitations and syncope.  Respiratory: Negative for cough.   Endocrine: Negative for cold intolerance.  Hematologic/Lymphatic: Does not bruise/bleed easily.  Skin: Negative for itching and rash.  Musculoskeletal: Negative for myalgias.  Gastrointestinal: Negative for abdominal pain, nausea and vomiting.  Genitourinary: Negative for dysuria.  Neurological: Negative for dizziness and weakness.  Psychiatric/Behavioral: The patient is not nervous/anxious.   All other systems reviewed and are negative.        Vitals:   01/22/19 1120  BP: (!) 134/99  Pulse: 71  Temp: (!) 97.3 F (36.3 C)  SpO2: 98%     Body mass index is 28.21 kg/m. Filed Weights   01/22/19 1120  Weight: 191 lb (86.6 kg)     Objective:   Physical Exam  Constitutional: He is oriented to person, place, and time. He appears well-developed and well-nourished. No distress.  HENT:  Head: Normocephalic and atraumatic.  Eyes: Pupils are equal, round, and reactive to light. Conjunctivae are normal.  Neck: No JVD present.  Cardiovascular: Normal rate, regular rhythm and intact distal pulses.  No murmur heard. Pulmonary/Chest: Effort  normal and breath sounds normal. He has no wheezes. He has no rales.  Abdominal: Soft. Bowel sounds are normal. There is no rebound.  Musculoskeletal:  General: No edema.  Lymphadenopathy:    He has no cervical adenopathy.  Neurological: He is alert and oriented to person, place, and time. No cranial nerve deficit.  Skin: Skin is warm and dry.  Psychiatric: He has a normal mood and affect.  Nursing note and vitals reviewed.         Assessment & Recommendations:   42 year old African-American male, former smoker, history of unprovoked DVT, with thromboembolic myocardial infarction on 01/07/2019:  Thromboembolic myocardial infarction: Episode occurred on 01/07/2019.  Ostial LAD thrombus without obvious atherosclerotic disease. High-sensitivity troponin peaked at 9500.  Echocardiogram did not show any wall motion abnormality, EF was normal. Recommend lifelong treatment with Xarelto, with Plavix at least for 1 year. Hypercoagulability work-up was unremarkable.  I do not think any additional work-up will change his clinical management, and this not warranted in this patient with no insurance coverage.  I have encouraged him to seek [employer]. Information was also provided to him to get patient assistance for Xarelto. Refills sent to pharmacy.   Tobacco abuse: Emphasized importance to quit smoking. Recommend using nicotine gum.   Thank you for referring the patient to [place]. Please feel free to contact with any questions.  Elder Negus, MD [facility]. [place] Pager: [phone] Office: [phone]

## 2019-01-22 ENCOUNTER — Encounter: Payer: Self-pay | Admitting: Cardiology

## 2019-01-22 ENCOUNTER — Other Ambulatory Visit: Payer: Self-pay

## 2019-01-22 ENCOUNTER — Ambulatory Visit (INDEPENDENT_AMBULATORY_CARE_PROVIDER_SITE_OTHER): Payer: Self-pay | Admitting: Cardiology

## 2019-01-22 VITALS — BP 134/99 | HR 71 | Temp 97.3°F | Ht 69.0 in | Wt 191.0 lb

## 2019-01-22 DIAGNOSIS — E782 Mixed hyperlipidemia: Secondary | ICD-10-CM

## 2019-01-22 DIAGNOSIS — Z86718 Personal history of other venous thrombosis and embolism: Secondary | ICD-10-CM

## 2019-01-22 DIAGNOSIS — I2102 ST elevation (STEMI) myocardial infarction involving left anterior descending coronary artery: Secondary | ICD-10-CM

## 2019-01-22 MED ORDER — CLOPIDOGREL BISULFATE 75 MG PO TABS: 75.0000 mg | ORAL_TABLET | Freq: Every day | ORAL | 3 refills | Status: AC

## 2019-01-22 MED ORDER — METOPROLOL TARTRATE 25 MG PO TABS: 25.0000 mg | ORAL_TABLET | Freq: Two times a day (BID) | ORAL | 2 refills | Status: AC

## 2019-01-22 MED ORDER — ATORVASTATIN CALCIUM 80 MG PO TABS: 80.0000 mg | ORAL_TABLET | Freq: Every day | ORAL | 3 refills | Status: AC

## 2019-01-22 MED ORDER — RIVAROXABAN 20 MG PO TABS: 20.0000 mg | ORAL_TABLET | Freq: Every day | ORAL | 2 refills | Status: DC

## 2019-03-04 ENCOUNTER — Ambulatory Visit (INDEPENDENT_AMBULATORY_CARE_PROVIDER_SITE_OTHER): Payer: Self-pay | Admitting: Family Medicine

## 2019-03-04 ENCOUNTER — Other Ambulatory Visit: Payer: Self-pay

## 2019-03-04 ENCOUNTER — Encounter: Payer: Self-pay | Admitting: Family Medicine

## 2019-03-04 VITALS — BP 130/82 | HR 58 | Temp 98.4°F | Resp 16 | Ht 69.0 in | Wt 195.0 lb

## 2019-03-04 DIAGNOSIS — Z86718 Personal history of other venous thrombosis and embolism: Secondary | ICD-10-CM

## 2019-03-04 DIAGNOSIS — I252 Old myocardial infarction: Secondary | ICD-10-CM

## 2019-03-04 DIAGNOSIS — F17200 Nicotine dependence, unspecified, uncomplicated: Secondary | ICD-10-CM

## 2019-03-04 MED ORDER — RIVAROXABAN 20 MG PO TABS: 20.0000 mg | ORAL_TABLET | Freq: Every day | ORAL | 2 refills | Status: AC

## 2019-03-04 NOTE — Progress Notes (Signed)
[facility]  Established Patient Office Visit  Subjective:  Patient ID: Jeremiah [name], male    DOB: [birth year]  Age: 43 y.o. MRN: [id number]  CC:  Chief Complaint  Patient presents with  . Follow-up    hopsital follow up for MI     HPI Jeremiah [name], a 43 year old male with a medical history significant for pulmonary embolism in 2018 on Xarelto, history of polysubstance abuse, and history of tobacco dependence presents for post hospital follow-up.  Patient says that he was in her usual state of health prior to 01/07/2019 and suddenly experienced chest pain characterized as squeezing pressure-like.  Patient just completed a plasma donation prior to chest pain and had 2 alcoholic beverages.  He denies any cocaine or marijuana use on that day.  Upon arrival to ER, patient was taken to Cath Lab.  He underwent coronary angiography that showed normal coronary arteries without any stenosis, but a filling defect in the ostial LAD that was suggestive of a thrombus.  Patient did not warrant intervention at that time and heparin and Aggrastat were initiated.  Patient had a complete resolution of ST elevation. Today, patient states that he feels well and is without complaint.  He denies chest pain, shortness of breath, bilateral lower extremity edema, blurred vision, dizziness, or paresthesias.  Patient is followed by [facility] and is scheduled to follow-up in 6 months.  Patient was transitioned to Plavix and Xarelto on 01/08/2019.  He was advised by cardiology to continue these agents indefinitely due to his history of unprovoked DVT and thromboembolic STEMI.  Patient's hypercoagulability work-up was unremarkable.   Past Medical History:  Diagnosis Date  . Anemia   . Asthma    as a child   . Blood clot in vein   . DVT (deep venous thrombosis) ([facility])   . Pulmonary embolism [facility])     Past Surgical History:  Procedure Laterality Date  .  LEFT HEART CATH AND CORONARY ANGIOGRAPHY N/A 01/07/2019   Procedure: LEFT HEART CATH AND CORONARY ANGIOGRAPHY;  Surgeon: Elder NegusPatwardhan, Manish J, MD;  Location: [facility];  Service: Cardiovascular;  Laterality: N/A;    Family History  Problem Relation Age of Onset  . Diabetes Father   . Hypertension Father   . Diabetes Brother   . Hypertension Brother   . Diabetes Paternal Aunt   . Hypertension Paternal Aunt   . Diabetes Paternal Grandmother   . Cancer Mother   . Diabetes Sister   . Hypertension Sister   . Diabetes Sister   . Hypertension Sister     Social History   Socioeconomic History  . Marital status: Single    Spouse name: Not on file  . Number of children: 2  . Years of education: Not on file  . Highest education level: Not on file  Occupational History  . Not on file  Tobacco Use  . Smoking status: Current Every Day Smoker    Packs/day: 0.25    Types: Cigarettes  . Smokeless tobacco: Never Used  Substance and Sexual Activity  . Alcohol use: Yes    Alcohol/week: 15.0 standard drinks    Types: 15 Cans of beer per week    Comment: occ  . Drug use: No  . Sexual activity: Not on file  Other Topics Concern  . Not on file  Social History Narrative  . Not on file   Social Determinants of Health   Financial  Resource Strain:   . Difficulty of Paying Living Expenses: Not on file  Food Insecurity:   . Worried About Programme researcher, broadcasting/film/video in the Last Year: Not on file  . Ran Out of Food in the Last Year: Not on file  Transportation Needs:   . Lack of Transportation (Medical): Not on file  . Lack of Transportation (Non-Medical): Not on file  Physical Activity:   . Days of Exercise per Week: Not on file  . Minutes of Exercise per Session: Not on file  Stress:   . Feeling of Stress : Not on file  Social Connections:   . Frequency of Communication with Friends and Family: Not on file  . Frequency of Social Gatherings with Friends and Family: Not on file  .  Attends Religious Services: Not on file  . Active Member of Clubs or Organizations: Not on file  . Attends Banker Meetings: Not on file  . Marital Status: Not on file  Intimate Partner Violence:   . Fear of Current or Ex-Partner: Not on file  . Emotionally Abused: Not on file  . Physically Abused: Not on file  . Sexually Abused: Not on file    Outpatient Medications Prior to Visit  Medication Sig Dispense Refill  . atorvastatin (LIPITOR) 80 MG tablet Take 1 tablet (80 mg total) by mouth daily at 6 PM. 90 tablet 3  . clopidogrel (PLAVIX) 75 MG tablet Take 1 tablet (75 mg total) by mouth daily. 90 tablet 3  . metoprolol tartrate (LOPRESSOR) 25 MG tablet Take 1 tablet (25 mg total) by mouth 2 (two) times daily. 180 tablet 2  . rivaroxaban (XARELTO) 20 MG TABS tablet Take 1 tablet (20 mg total) by mouth daily with supper. 90 tablet 2   No facility-administered medications prior to visit.    Allergies  Allergen Reactions  . Amoxicillin Anaphylaxis    Did it involve swelling of the face/tongue/throat, SOB, or low BP? Yes Did it involve sudden or severe rash/hives, skin peeling, or any reaction on the inside of your mouth or nose? Yes Did you need to seek medical attention at a hospital or doctor's office? Yes When did it last happen?43 YEARS OLD If all above answers are "NO", may proceed with cephalosporin use.   Marland Kitchen Penicillins Anaphylaxis    Did it involve swelling of the face/tongue/throat, SOB, or low BP? Yes Did it involve sudden or severe rash/hives, skin peeling, or any reaction on the inside of your mouth or nose? Yes Did you need to seek medical attention at a hospital or doctor's office? Yes When did it last happen?CHILDHOOD If all above answers are "NO", may proceed with cephalosporin use.   . Nitroglycerin     "throat closing and tongue heaviness sensation" with SL and IV NTG    ROS Review of Systems  Constitutional: Negative for fatigue and  fever.  HENT: Negative.   Respiratory: Negative.  Negative for chest tightness and shortness of breath.   Cardiovascular: Negative.  Negative for palpitations and leg swelling.  Gastrointestinal: Negative.   Endocrine: Negative for polydipsia, polyphagia and polyuria.  Genitourinary: Negative.   Musculoskeletal: Negative.   Neurological: Negative for dizziness.  Hematological: Negative.   Psychiatric/Behavioral: Negative.       Objective:    Physical Exam  Constitutional: He is oriented to person, place, and time. He appears well-developed and well-nourished.  HENT:  Head: Normocephalic.  Cardiovascular: Normal rate and regular rhythm.  Pulmonary/Chest: Effort normal and breath  sounds normal.  Abdominal: Soft. Bowel sounds are normal.  Musculoskeletal:        General: Normal range of motion.     Cervical back: Normal range of motion.  Neurological: He is alert and oriented to person, place, and time.  Skin: Skin is warm and dry.  Psychiatric: He has a normal mood and affect. His behavior is normal. Judgment and thought content normal.    BP 130/82 (BP Location: Right Arm, Patient Position: Sitting, Cuff Size: Large)   Pulse (!) 58   Temp 98.4 F (36.9 C) (Oral)   Resp 16   Ht 5\' 9"  (1.753 m)   Wt 195 lb (88.5 kg)   SpO2 99%   BMI 28.80 kg/m  Wt Readings from Last 3 Encounters:  03/04/19 195 lb (88.5 kg)  01/22/19 191 lb (86.6 kg)  01/09/19 187 lb 11.2 oz (85.1 kg)     There are no preventive care reminders to display for this patient.  There are no preventive care reminders to display for this patient.  Lab Results  Component Value Date   TSH 0.980 02/05/2015   Lab Results  Component Value Date   WBC 9.0 01/09/2019   HGB 15.4 01/09/2019   HCT 44.2 01/09/2019   MCV 92.7 01/09/2019   PLT 211 01/09/2019   Lab Results  Component Value Date   NA 140 01/09/2019   K 4.6 01/09/2019   CO2 25 01/09/2019   GLUCOSE 104 (H) 01/09/2019   BUN 13 01/09/2019    CREATININE 1.48 (H) 01/09/2019   BILITOT 1.2 01/07/2019   ALKPHOS 56 01/07/2019   AST 23 01/07/2019   ALT 21 01/07/2019   PROT 4.9 (L) 01/07/2019   ALBUMIN 3.1 (L) 01/07/2019   CALCIUM 8.5 (L) 01/09/2019   ANIONGAP 5 01/09/2019   Lab Results  Component Value Date   CHOL 210 (H) 06/14/2018   Lab Results  Component Value Date   HDL 48 06/14/2018   Lab Results  Component Value Date   LDLCALC 149 (H) 06/14/2018   Lab Results  Component Value Date   TRIG 66 06/14/2018   Lab Results  Component Value Date   CHOLHDL 4.4 06/14/2018   Lab Results  Component Value Date   HGBA1C 5.5 09/05/2016      Assessment & Plan:   Problem List Items Addressed This Visit    None    1. History of DVT (deep vein thrombosis) Continue Xarelto indefinitely.   - rivaroxaban (XARELTO) 20 MG TABS tablet; Take 1 tablet (20 mg total) by mouth daily with supper.  Dispense: 90 tablet; Refill: 2  2. Tobacco dependence Smoking cessation instruction/counseling given:  counseled patient on the dangers of tobacco use, advised patient to stop smoking, and reviewed strategies to maximize success  3. History of MI (myocardial infarction) Follow up with Dr. Vernell Leep, cardiologist as scheduled.  Discussed the importance of follow a lowfat, low sodium diet and smoking cessation.   Follow-up: Return for complete physical examination.     Donia Pounds  APRN, MSN, FNP-C [facility] [address] [place], [place] [address] [phone]  This note was prepared using Dragon speech recognition software, errors in dictation are unintentional.

## 2019-03-04 NOTE — Patient Instructions (Signed)
Pulmonary Embolism  A pulmonary embolism (PE) is a sudden blockage or decrease of blood flow in one or both lungs. Most blockages come from a blood clot that forms in the vein of a lower leg, thigh, or arm (deep vein thrombosis, DVT) and travels to the lungs. A clot is blood that has thickened into a gel or solid. PE is a dangerous and life-threatening condition that needs to be treated right away. What are the causes? This condition is usually caused by a blood clot that forms in a vein and moves to the lungs. In rare cases, it may be caused by air, fat, part of a tumor, or other tissue that moves through the veins and into the lungs. What increases the risk? The following factors may make you more likely to develop this condition:  Experiencing a traumatic injury, such as breaking a hip or leg.  Having: ? A spinal cord injury. ? Orthopedic surgery, especially hip or knee replacement. ? Any major surgery. ? A stroke. ? DVT. ? Blood clots or blood clotting disease. ? Long-term (chronic) lung or heart disease. ? Cancer treated with chemotherapy. ? A central venous catheter.  Taking medicines that contain estrogen. These include birth control pills and hormone replacement therapy.  Being: ? Pregnant. ? In the period of time after your baby is delivered (postpartum). ? Older than age 68. ? Overweight. ? A smoker, especially if you have other risks. What are the signs or symptoms? Symptoms of this condition usually start suddenly and include:  Shortness of breath during activity or at rest.  Coughing, coughing up blood, or coughing up blood-tinged mucus.  Chest pain that is often worse with deep breaths.  Rapid or irregular heartbeat.  Feeling light-headed or dizzy.  Fainting.  Feeling anxious.  Fever.  Sweating.  Pain and swelling in a leg. This is a symptom of DVT, which can lead to PE. How is this diagnosed? This condition may be diagnosed based on:  Your medical  history.  A physical exam.  Blood tests.  CT pulmonary angiogram. This test checks blood flow in and around your lungs.  Ventilation-perfusion scan, also called a lung VQ scan. This test measures air flow and blood flow to the lungs.  An ultrasound of the legs. How is this treated? Treatment for this condition depends on many factors, such as the cause of your PE, your risk for bleeding or developing more clots, and other medical conditions you have. Treatment aims to remove, dissolve, or stop blood clots from forming or growing larger. Treatment may include:  Medicines, such as: ? Blood thinning medicines (anticoagulants) to stop clots from forming. ? Medicines that dissolve clots (thrombolytics).  Procedures, such as: ? Using a flexible tube to remove a blood clot (embolectomy) or to deliver medicine to destroy it (catheter-directed thrombolysis). ? Inserting a filter into a large vein that carries blood to the heart (inferior vena cava). This filter (vena cava filter) catches blood clots before they reach the lungs. ? Surgery to remove the clot (surgical embolectomy). This is rare. You may need a combination of immediate, long-term (up to 3 months after diagnosis), and extended (more than 3 months after diagnosis) treatments. Your treatment may continue for several months (maintenance therapy). You and your health care provider will work together to choose the treatment program that is best for you. Follow these instructions at home: Medicines  Take over-the-counter and prescription medicines only as told by your health care provider.  If you  are taking an anticoagulant medicine: ? Take the medicine every day at the same time each day. ? Understand what foods and drugs interact with your medicine. ? Understand the side effects of this medicine, including excessive bruising or bleeding. Ask your health care provider or pharmacist about other side effects. General instructions   Wear a medical alert bracelet or carry a medical alert card that says you have had a PE and lists what medicines you take.  Ask your health care provider when you may return to your normal activities. Avoid sitting or lying for a long time without moving.  Maintain a healthy weight. Ask your health care provider what weight is healthy for you.  Do not use any products that contain nicotine or tobacco, such as cigarettes, e-cigarettes, and chewing tobacco. If you need help quitting, ask your health care provider.  Talk with your health care provider about any travel plans. It is important to make sure that you are still able to take your medicine while on trips.  Keep all follow-up visits as told by your health care provider. This is important. Contact a health care provider if:  You missed a dose of your blood thinner medicine. Get help right away if:  You have: ? New or increased pain, swelling, warmth, or redness in an arm or leg. ? Numbness or tingling in an arm or leg. ? Shortness of breath during activity or at rest. ? A fever. ? Chest pain. ? A rapid or irregular heartbeat. ? A severe headache. ? Vision changes. ? A serious fall or accident, or you hit your head. ? Stomach (abdominal) pain. ? Blood in your vomit, stool, or urine. ? A cut that will not stop bleeding.  You cough up blood.  You feel light-headed or dizzy.  You cannot move your arms or legs.  You are confused or have memory loss. These symptoms may represent a serious problem that is an emergency. Do not wait to see if the symptoms will go away. Get medical help right away. Call your local emergency services (911 in the [place] Do not drive yourself to the hospital. Summary  A pulmonary embolism (PE) is a sudden blockage or decrease of blood flow in one or both lungs. PE is a dangerous and life-threatening condition that needs to be treated right away.  Treatments for this condition usually include  medicines to thin your blood (anticoagulants) or medicines to break apart blood clots (thrombolytics).  If you are given blood thinners, it is important to take the medicine every day at the same time each day.  Understand what foods and drugs interact with any medicines that you are taking.  If you have signs of PE or DVT, call your local emergency services (911 in the [place] This information is not intended to replace advice given to you by your health care provider. Make sure you discuss any questions you have with your health care provider. Document Released: 03/03/2000 Document Revised: 12/12/2017 Document Reviewed: 12/12/2017 Elsevier Patient Education  2020 [employer].  

## 2019-03-09 DIAGNOSIS — F17200 Nicotine dependence, unspecified, uncomplicated: Secondary | ICD-10-CM | POA: Insufficient documentation

## 2019-03-09 DIAGNOSIS — I252 Old myocardial infarction: Secondary | ICD-10-CM | POA: Insufficient documentation

## 2019-05-01 ENCOUNTER — Ambulatory Visit: Payer: Self-pay | Admitting: Cardiology

## 2019-05-02 ENCOUNTER — Telehealth: Payer: Self-pay | Admitting: Family Medicine

## 2019-05-02 NOTE — Telephone Encounter (Signed)
Called and spoke with patient, advised that we have not done labs since 06/14/18 in this office. He gave me the name of Victorino Dike from [facility] who was requesting these for Dr. Rosemary Holms. I called and spoke with Victorino Dike advised that we have not done labs since 05/2018 and that it looks like Dr. Rosemary Holms is in Epic and should have access to all our labs. Victorino Dike says she will advised Dr. Rosemary Holms of this and have pt repeat lipid panel if needed.

## 2019-05-08 ENCOUNTER — Telehealth: Payer: Self-pay | Admitting: Cardiology

## 2019-05-08 ENCOUNTER — Other Ambulatory Visit: Payer: Self-pay

## 2019-05-08 NOTE — Progress Notes (Signed)
Labs not available. Will reschedule.

## 2019-05-09 ENCOUNTER — Telehealth: Payer: Self-pay

## 2019-05-12 ENCOUNTER — Other Ambulatory Visit (HOSPITAL_COMMUNITY): Payer: Self-pay | Admitting: Cardiology

## 2019-05-13 LAB — LIPID PANEL
Chol/HDL Ratio: 4 ratio (ref 0.0–5.0)
Cholesterol, Total: 179 mg/dL (ref 100–199)
HDL: 45 mg/dL (ref >39)
LDL Chol Calc (NIH): 109 mg/dL — ABNORMAL HIGH (ref 0–99)
Triglycerides: 142 mg/dL (ref 0–149)
VLDL Cholesterol Cal: 25 mg/dL (ref 5–40)

## 2019-05-13 NOTE — Progress Notes (Signed)
LDL (bad cholesterol) has decreased from 149 in 05/2018 to 109 now. This is improvement, but not optimal enough. Please ask him if he is compliant with lipitor 80 mg daily. If yes, may need to add Zetia 10 mg daily.  Thanks MJP

## 2019-05-14 NOTE — Progress Notes (Signed)
Called pt to inform him about his lab results. Pt mention he is not taking lipitor do that his insurance did not cover it. But pt has been watching him diet.

## 2019-09-02 ENCOUNTER — Encounter: Payer: Self-pay | Admitting: Family Medicine

## 2019-10-13 ENCOUNTER — Encounter (HOSPITAL_COMMUNITY): Payer: Self-pay

## 2019-10-13 ENCOUNTER — Emergency Department (HOSPITAL_COMMUNITY)
Admission: EM | Admit: 2019-10-13 | Discharge: 2019-10-13 | Disposition: A | Discharge status: Home / Self Care | Payer: HRSA Program | Attending: Emergency Medicine | Admitting: Emergency Medicine

## 2019-10-13 ENCOUNTER — Other Ambulatory Visit: Payer: Self-pay

## 2019-10-13 DIAGNOSIS — Z79899 Other long term (current) drug therapy: Secondary | ICD-10-CM | POA: Diagnosis not present

## 2019-10-13 DIAGNOSIS — Z7901 Long term (current) use of anticoagulants: Secondary | ICD-10-CM | POA: Diagnosis not present

## 2019-10-13 DIAGNOSIS — J45909 Unspecified asthma, uncomplicated: Secondary | ICD-10-CM | POA: Insufficient documentation

## 2019-10-13 DIAGNOSIS — J069 Acute upper respiratory infection, unspecified: Secondary | ICD-10-CM | POA: Diagnosis not present

## 2019-10-13 DIAGNOSIS — Z880 Allergy status to penicillin: Secondary | ICD-10-CM | POA: Insufficient documentation

## 2019-10-13 DIAGNOSIS — R05 Cough: Secondary | ICD-10-CM | POA: Diagnosis present

## 2019-10-13 DIAGNOSIS — Z20822 Contact with and (suspected) exposure to covid-19: Secondary | ICD-10-CM | POA: Diagnosis not present

## 2019-10-13 DIAGNOSIS — F17210 Nicotine dependence, cigarettes, uncomplicated: Secondary | ICD-10-CM | POA: Diagnosis not present

## 2019-10-13 LAB — SARS CORONAVIRUS 2 BY RT PCR (HOSPITAL ORDER, PERFORMED IN ~~LOC~~ HOSPITAL LAB): SARS Coronavirus 2: NEGATIVE

## 2019-10-13 MED ORDER — ALBUTEROL SULFATE HFA 108 (90 BASE) MCG/ACT IN AERS: 1.0000 | INHALATION_SPRAY | Freq: Four times a day (QID) | RESPIRATORY_TRACT | 0 refills | Status: AC | PRN

## 2019-10-13 MED ORDER — BENZONATATE 100 MG PO CAPS: 100.0000 mg | ORAL_CAPSULE | Freq: Three times a day (TID) | ORAL | 0 refills | Status: AC

## 2019-10-13 NOTE — ED Triage Notes (Signed)
Pt present to ed requesting covid test d/t congestion and cough. Denies sob/fever.

## 2019-10-13 NOTE — ED Provider Notes (Signed)
[facility] EMERGENCY DEPARTMENT Provider Note   CSN: [id number] Arrival date & time: 10/13/19  1315    History Chief Complaint  Patient presents with  . Cough    Jeremiah [name] is a 44 y.o. male with past history significant for DVT, PE, chronic anticoagulation, MI who presents for evaluation of cough and congestion.  Symptoms started last week.  Symptoms have significantly improved.  He was sent here by work to obtain Covid test to return to work.  He was out for approximately 1 week.  States still occasionally has a dry cough.  Had seasonal allergy and asthma as a child however has not had any issues with this.  He is compliant with his anticoagulation.  No unilateral leg swelling, redness or warmth.  Patient denies fever, chills, nausea, vomiting, chest pain, shortness of breath, neck pain, neck stiffness, sore throat, drooling, dysphagia, trismus, domino pain, diarrhea, dysuria.  Denies aggravating or relieving factors.  No known Covid exposures.  History obtained from patient and past medical records.  No interpreter used.  HPI     Past Medical History:  Diagnosis Date  . Anemia   . Asthma    as a child   . Blood clot in vein   . DVT (deep venous thrombosis) (HCC)   . Pulmonary embolism [facility])     Patient Active Problem List   Diagnosis Date Noted  . History of MI (myocardial infarction) 03/09/2019  . Tobacco dependence 03/09/2019  . History of DVT (deep vein thrombosis) 01/21/2019  . ST elevation myocardial infarction involving left anterior descending (LAD) coronary artery (HCC) 01/07/2019  . Chest pain 06/10/2018  . Pulmonary embolism, bilateral (HCC) 08/09/2016  . Acute deep vein thrombosis (DVT) of lower extremity (HCC) 08/09/2016  . Pulmonary embolism (HCC) 08/09/2016  . Annual physical exam 02/06/2015  . Immunization due 02/06/2015    Past Surgical History:  Procedure Laterality Date  . LEFT HEART CATH AND CORONARY ANGIOGRAPHY N/A 01/07/2019    Procedure: LEFT HEART CATH AND CORONARY ANGIOGRAPHY;  Surgeon: Elder Negus, MD;  Location: [facility] INVASIVE CV LAB;  Service: Cardiovascular;  Laterality: N/A;       Family History  Problem Relation Age of Onset  . Diabetes Father   . Hypertension Father   . Diabetes Brother   . Hypertension Brother   . Diabetes Paternal Aunt   . Hypertension Paternal Aunt   . Diabetes Paternal Grandmother   . Cancer Mother   . Diabetes Sister   . Hypertension Sister   . Diabetes Sister   . Hypertension Sister     Social History   Tobacco Use  . Smoking status: Current Every Day Smoker    Packs/day: 0.25    Types: Cigarettes  . Smokeless tobacco: Never Used  Vaping Use  . Vaping Use: Former  Substance Use Topics  . Alcohol use: Yes    Alcohol/week: 15.0 standard drinks    Types: 15 Cans of beer per week    Comment: occ  . Drug use: No    Home Medications Prior to Admission medications   Medication Sig Start Date End Date Taking? Authorizing Provider  albuterol (VENTOLIN HFA) 108 (90 Base) MCG/ACT inhaler Inhale 1-2 puffs into the lungs every 6 (six) hours as needed for wheezing or shortness of breath. 10/13/19   Kynnedi Zweig A, PA-C  atorvastatin (LIPITOR) 80 MG tablet Take 1 tablet (80 mg total) by mouth daily at 6 PM. 01/22/19   Patwardhan, [employer],  MD  benzonatate (TESSALON) 100 MG capsule Take 1 capsule (100 mg total) by mouth every 8 (eight) hours. 10/13/19   Silvanna Ohmer A, PA-C  clopidogrel (PLAVIX) 75 MG tablet Take 1 tablet (75 mg total) by mouth daily. 01/22/19   Patwardhan, Anabel Bene, MD  metoprolol tartrate (LOPRESSOR) 25 MG tablet Take 1 tablet (25 mg total) by mouth 2 (two) times daily. 01/22/19   Patwardhan, Anabel Bene, MD  rivaroxaban (XARELTO) 20 MG TABS tablet Take 1 tablet (20 mg total) by mouth daily with supper. 03/04/19   Massie Maroon, FNP    Allergies    Amoxicillin, Penicillins, and Nitroglycerin  Review of Systems   Review of Systems    Constitutional: Negative.   HENT: Positive for congestion and rhinorrhea.   Respiratory: Positive for cough. Negative for apnea, choking, chest tightness, shortness of breath, wheezing and stridor.   Cardiovascular: Negative.   Gastrointestinal: Negative.   Genitourinary: Negative.   Musculoskeletal: Negative.   Skin: Negative.   Neurological: Negative.   All other systems reviewed and are negative.   Physical Exam Updated Vital Signs BP (!) 146/98 (BP Location: Right Arm)   Pulse 65   Temp 98 F (36.7 C) (Oral)   Resp 16   SpO2 100%   Physical Exam Vitals and nursing note reviewed.  Constitutional:      General: He is not in acute distress.    Appearance: He is not ill-appearing, toxic-appearing or diaphoretic.  HENT:     Head: Normocephalic and atraumatic.     Jaw: There is normal jaw occlusion.     Right Ear: Tympanic membrane, ear canal and external ear normal. There is no impacted cerumen. No hemotympanum. Tympanic membrane is not injected, scarred, perforated, erythematous, retracted or bulging.     Left Ear: Tympanic membrane, ear canal and external ear normal. There is no impacted cerumen. No hemotympanum. Tympanic membrane is not injected, scarred, perforated, erythematous, retracted or bulging.     Ears:     Comments: No Mastoid tenderness.    Nose:     Comments: Clear rhinorrhea and congestion to bilateral nares.  No sinus tenderness.    Mouth/Throat:     Comments: Posterior oropharynx clear.  Mucous membranes moist.  Tonsils without erythema or exudate.  Uvula midline without deviation.  No evidence of PTA or RPA.  No drooling, dysphasia or trismus.  Phonation normal. Neck:     Trachea: Trachea and phonation normal.     Meningeal: Brudzinski's sign and Kernig's sign absent.     Comments: No Neck stiffness or neck rigidity.  No meningismus.  No cervical lymphadenopathy. Cardiovascular:     Comments: No murmurs rubs or gallops. Pulmonary:     Effort: Pulmonary  effort is normal. No tachypnea or accessory muscle usage.     Breath sounds: Normal air entry. No decreased air movement or transmitted upper airway sounds.     Comments: Minimal expiratory wheeze. No accessory muscle usage.  Able speak in full sentences. Ambulatory without hypoxia Abdominal:     Comments: Soft, nontender without rebound or guarding.  No CVA tenderness.  Musculoskeletal:     Comments: Moves all 4 extremities without difficulty.  Lower extremities without edema, erythema or warmth.  Skin:    Comments: Brisk capillary refill.  No rashes or lesions.  Neurological:     Mental Status: He is alert.     Comments: Ambulatory in department without difficulty.  Cranial nerves II through XII grossly intact.  No facial  droop.  No aphasia.     ED Results / Procedures / Treatments   Labs (all labs ordered are listed, but only abnormal results are displayed) Labs Reviewed  SARS CORONAVIRUS 2 BY RT PCR ([facility])    EKG None  Radiology No results found.  Procedures Procedures (including critical care time)  Medications Ordered in ED Medications - No data to display  ED Course  I have reviewed the triage vital signs and the nursing notes.  Pertinent labs & imaging results that were available during my care of the patient were reviewed by me and considered in my medical decision making (see chart for details).  44 year old presents for evaluation of cough congestion and rhinorrhea.  Symptoms started 1 week ago.  Have significantly improved.  Patient sent here by work for [employer] test to return to work.  He is afebrile, nonseptic, non-ill-appearing.  Does have history of PE however compliant with his anticoagulation.  No chest pain, shortness of breath, hemoptysis, lateral leg swelling, redness, warmth.  No loss of taste or smell.  No drooling, dysphagia or trismus.  He is tolerating p.o. intake without difficulty.  Does have some very  minimal expiratory wheeze however no hypoxia, tachypnea or tachycardia.  Appears overall well.  Offered chest x-ray however patient declined stating "I need to get home."  His rapid Covid test here was negative.  Abdomen soft, nontender.  Compartments soft.  Clinically no evidence of DVT.  Will DC home with albuterol, Tessalon Perles.  Discussed symptomatic management.  He may return for any worsening symptoms.  The patient has been appropriately medically screened and/or stabilized in the ED. I have low suspicion for any other emergent medical condition which would require further screening, evaluation or treatment in the ED or require inpatient management.  Patient is hemodynamically stable and in no acute distress.  Patient able to ambulate in department prior to ED.  Evaluation does not show acute pathology that would require ongoing or additional emergent interventions while in the emergency department or further inpatient treatment.  I have discussed the diagnosis with the patient and answered all questions.  Pain is been managed while in the emergency department and patient has no further complaints prior to discharge.  Patient is comfortable with plan discussed in room and is stable for discharge at this time.  I have discussed strict return precautions for returning to the emergency department.  Patient was encouraged to follow-up with PCP/specialist refer to at discharge.    MDM Rules/Calculators/A&P                          Jackelyn KnifeRahki R [name] was evaluated in Emergency Department on 10/13/2019 for the symptoms described in the history of present illness. He was evaluated in the context of the global COVID-19 pandemic, which necessitated consideration that the patient might be at risk for infection with the SARS-CoV-2 virus that causes COVID-19. Institutional protocols and algorithms that pertain to the evaluation of patients at risk for COVID-19 are in a state of rapid change based on information  released by regulatory bodies including the CDC and federal and state organizations. These policies and algorithms were followed during the patient's care in the ED. Final Clinical Impression(s) / ED Diagnoses Final diagnoses:  Viral URI with cough    Rx / DC Orders ED Discharge Orders         Ordered    benzonatate (TESSALON) 100  MG capsule  Every 8 hours     Discontinue  Reprint     10/13/19 1621    albuterol (VENTOLIN HFA) 108 (90 Base) MCG/ACT inhaler  Every 6 hours PRN     Discontinue  Reprint     10/13/19 1622           Pearlene Teat A, PA-C 10/13/19 1639    Raeford Razor, MD 10/13/19 1851

## 2019-10-13 NOTE — Discharge Instructions (Signed)
COVID test negative.  Likely bronchitis. Take the Tessalon pearls for cough.  Return for new or worsening symptoms

## 2019-12-12 ENCOUNTER — Telehealth: Payer: Self-pay | Admitting: Family Medicine

## 2019-12-12 NOTE — Telephone Encounter (Signed)
Jeremiah [name] is a 44 year old male with a medical history of pulmonary embolism on Xarelto, DVT, and myocardial infarction called inquiring whether it safe for him to receive Covid vaccination.  Patient advised to get the Covid vaccination.  Discussed Covid vaccination with patient at length.  Also, patient given information on Covid vaccination locations.   Nolon Nations  APRN, MSN, FNP-C Patient [facility] [address] [place], [place] [address] [phone]
